# Patient Record
Sex: Female | Born: 1985 | Race: Black or African American | Hispanic: No | Marital: Single | Smoking: Never smoker
Health system: Southern US, Community
[De-identification: ages and names within clinical notes are randomized; demographics above are authoritative.]

## PROBLEM LIST (undated history)

## (undated) ENCOUNTER — Inpatient Hospital Stay (HOSPITAL_COMMUNITY): Payer: Self-pay

## (undated) DIAGNOSIS — K219 Gastro-esophageal reflux disease without esophagitis: Secondary | ICD-10-CM

## (undated) DIAGNOSIS — K589 Irritable bowel syndrome without diarrhea: Secondary | ICD-10-CM

## (undated) DIAGNOSIS — J309 Allergic rhinitis, unspecified: Secondary | ICD-10-CM

## (undated) DIAGNOSIS — F419 Anxiety disorder, unspecified: Secondary | ICD-10-CM

## (undated) DIAGNOSIS — M419 Scoliosis, unspecified: Secondary | ICD-10-CM

## (undated) HISTORY — PX: INDUCED ABORTION: SHX677

---
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
## 2007-04-29 ENCOUNTER — Emergency Department (HOSPITAL_COMMUNITY): Admission: EM | Admit: 2007-04-29 | Discharge: 2007-04-29 | Payer: Self-pay | Admitting: Emergency Medicine

## 2007-07-24 ENCOUNTER — Emergency Department (HOSPITAL_COMMUNITY): Admission: EM | Admit: 2007-07-24 | Discharge: 2007-07-24 | Payer: Self-pay | Admitting: Emergency Medicine

## 2007-11-11 ENCOUNTER — Emergency Department (HOSPITAL_COMMUNITY): Admission: EM | Admit: 2007-11-11 | Discharge: 2007-11-11 | Payer: Self-pay | Admitting: Emergency Medicine

## 2007-12-18 ENCOUNTER — Inpatient Hospital Stay (HOSPITAL_COMMUNITY): Admission: AD | Admit: 2007-12-18 | Discharge: 2007-12-18 | Payer: Self-pay | Admitting: Obstetrics & Gynecology

## 2008-07-31 ENCOUNTER — Emergency Department (HOSPITAL_COMMUNITY): Admission: EM | Admit: 2008-07-31 | Discharge: 2008-07-31 | Payer: Self-pay | Admitting: Emergency Medicine

## 2009-01-31 ENCOUNTER — Inpatient Hospital Stay (HOSPITAL_COMMUNITY): Admission: AD | Admit: 2009-01-31 | Discharge: 2009-01-31 | Payer: Self-pay | Admitting: Obstetrics and Gynecology

## 2009-04-13 ENCOUNTER — Ambulatory Visit: Payer: Self-pay | Admitting: Obstetrics & Gynecology

## 2009-04-14 ENCOUNTER — Encounter: Payer: Self-pay | Admitting: Obstetrics and Gynecology

## 2009-04-15 ENCOUNTER — Encounter: Payer: Self-pay | Admitting: Obstetrics and Gynecology

## 2009-04-15 LAB — CONVERTED CEMR LAB
HCT: 35.6 % — ABNORMAL LOW (ref 36.0–46.0)
Hemoglobin: 11.7 g/dL — ABNORMAL LOW (ref 12.0–15.0)
Hepatitis B Surface Ag: NEGATIVE
Hgb A2 Quant: 2.4 % (ref 2.2–3.2)
Hgb A: 95.3 % — ABNORMAL LOW (ref 96.8–97.8)
Hgb F Quant: 2.3 % — ABNORMAL HIGH (ref 0.0–2.0)
Hgb S Quant: 0 % (ref 0.0–0.0)
Lymphs Abs: 2.5 10*3/uL (ref 0.7–4.0)
MCHC: 32.9 g/dL (ref 30.0–36.0)
Monocytes Absolute: 0.5 10*3/uL (ref 0.1–1.0)
RBC: 3.72 M/uL — ABNORMAL LOW (ref 3.87–5.11)
Rh Type: POSITIVE
Rubella: 77 intl units/mL — ABNORMAL HIGH

## 2009-04-21 ENCOUNTER — Ambulatory Visit: Payer: Self-pay | Admitting: Obstetrics and Gynecology

## 2009-04-21 ENCOUNTER — Encounter: Payer: Self-pay | Admitting: Obstetrics and Gynecology

## 2009-04-21 ENCOUNTER — Other Ambulatory Visit: Admission: RE | Admit: 2009-04-21 | Discharge: 2009-04-21 | Payer: Self-pay | Admitting: Obstetrics & Gynecology

## 2009-04-26 ENCOUNTER — Ambulatory Visit (HOSPITAL_COMMUNITY): Admission: RE | Admit: 2009-04-26 | Discharge: 2009-04-26 | Payer: Self-pay | Admitting: Obstetrics & Gynecology

## 2009-05-19 ENCOUNTER — Ambulatory Visit: Payer: Self-pay | Admitting: Obstetrics and Gynecology

## 2009-06-19 ENCOUNTER — Ambulatory Visit: Payer: Self-pay | Admitting: Physician Assistant

## 2009-06-20 ENCOUNTER — Encounter: Payer: Self-pay | Admitting: Obstetrics and Gynecology

## 2009-06-20 LAB — CONVERTED CEMR LAB: Chlamydia, Swab/Urine, PCR: NEGATIVE

## 2009-07-14 ENCOUNTER — Encounter: Payer: Self-pay | Admitting: Obstetrics & Gynecology

## 2009-07-14 ENCOUNTER — Ambulatory Visit: Payer: Self-pay | Admitting: Obstetrics and Gynecology

## 2009-07-14 LAB — CONVERTED CEMR LAB
Platelets: 167 10*3/uL (ref 150–400)
RBC: 3.27 M/uL — ABNORMAL LOW (ref 3.87–5.11)
WBC: 8.4 10*3/uL (ref 4.0–10.5)

## 2009-07-28 ENCOUNTER — Ambulatory Visit: Payer: Self-pay | Admitting: Physician Assistant

## 2009-08-11 ENCOUNTER — Ambulatory Visit: Payer: Self-pay | Admitting: Physician Assistant

## 2009-08-25 ENCOUNTER — Ambulatory Visit: Payer: Self-pay | Admitting: Obstetrics and Gynecology

## 2009-08-28 ENCOUNTER — Ambulatory Visit: Payer: Self-pay | Admitting: Obstetrics and Gynecology

## 2009-08-28 ENCOUNTER — Inpatient Hospital Stay (HOSPITAL_COMMUNITY): Admission: AD | Admit: 2009-08-28 | Discharge: 2009-08-29 | Payer: Self-pay | Admitting: Obstetrics & Gynecology

## 2009-09-01 ENCOUNTER — Inpatient Hospital Stay (HOSPITAL_COMMUNITY): Admission: AD | Admit: 2009-09-01 | Discharge: 2009-09-01 | Payer: Self-pay | Admitting: Obstetrics & Gynecology

## 2009-09-01 ENCOUNTER — Ambulatory Visit: Payer: Self-pay | Admitting: Advanced Practice Midwife

## 2009-09-01 ENCOUNTER — Ambulatory Visit: Payer: Self-pay | Admitting: Family Medicine

## 2009-09-02 ENCOUNTER — Encounter: Payer: Self-pay | Admitting: Obstetrics & Gynecology

## 2009-09-02 LAB — CONVERTED CEMR LAB
Trich, Wet Prep: NONE SEEN
Yeast Wet Prep HPF POC: NONE SEEN

## 2009-09-08 ENCOUNTER — Ambulatory Visit: Payer: Self-pay | Admitting: Advanced Practice Midwife

## 2009-09-15 ENCOUNTER — Ambulatory Visit: Payer: Self-pay | Admitting: Family

## 2009-09-20 ENCOUNTER — Inpatient Hospital Stay (HOSPITAL_COMMUNITY): Admission: AD | Admit: 2009-09-20 | Discharge: 2009-09-20 | Payer: Self-pay | Admitting: Obstetrics & Gynecology

## 2009-09-20 ENCOUNTER — Ambulatory Visit: Payer: Self-pay | Admitting: Obstetrics and Gynecology

## 2009-09-22 ENCOUNTER — Ambulatory Visit: Payer: Self-pay | Admitting: Physician Assistant

## 2009-09-22 ENCOUNTER — Inpatient Hospital Stay (HOSPITAL_COMMUNITY): Admission: AD | Admit: 2009-09-22 | Discharge: 2009-09-25 | Payer: Self-pay | Admitting: Obstetrics and Gynecology

## 2009-09-22 ENCOUNTER — Ambulatory Visit: Payer: Self-pay | Admitting: Family Medicine

## 2009-11-10 ENCOUNTER — Ambulatory Visit: Payer: Self-pay | Admitting: Advanced Practice Midwife

## 2009-11-11 ENCOUNTER — Encounter: Payer: Self-pay | Admitting: Obstetrics and Gynecology

## 2009-12-20 ENCOUNTER — Inpatient Hospital Stay (HOSPITAL_COMMUNITY): Admission: AD | Admit: 2009-12-20 | Discharge: 2009-12-20 | Payer: Self-pay | Admitting: Obstetrics & Gynecology

## 2009-12-20 ENCOUNTER — Ambulatory Visit: Payer: Self-pay | Admitting: Obstetrics and Gynecology

## 2010-03-24 ENCOUNTER — Emergency Department (HOSPITAL_COMMUNITY): Admission: EM | Admit: 2010-03-24 | Discharge: 2010-03-24 | Payer: Self-pay | Admitting: Emergency Medicine

## 2010-07-07 ENCOUNTER — Emergency Department (HOSPITAL_COMMUNITY)
Admission: EM | Admit: 2010-07-07 | Discharge: 2010-07-07 | Payer: Self-pay | Source: Home / Self Care | Admitting: Emergency Medicine

## 2010-07-10 LAB — RAPID STREP SCREEN (MED CTR MEBANE ONLY): Streptococcus, Group A Screen (Direct): NEGATIVE

## 2010-08-08 ENCOUNTER — Emergency Department (HOSPITAL_COMMUNITY)
Admission: EM | Admit: 2010-08-08 | Discharge: 2010-08-08 | Disposition: A | Discharge status: Home / Self Care | Payer: Self-pay | Attending: Emergency Medicine | Admitting: Emergency Medicine

## 2010-08-08 DIAGNOSIS — H60399 Other infective otitis externa, unspecified ear: Secondary | ICD-10-CM | POA: Insufficient documentation

## 2010-08-08 DIAGNOSIS — H6690 Otitis media, unspecified, unspecified ear: Secondary | ICD-10-CM | POA: Insufficient documentation

## 2010-08-08 DIAGNOSIS — H9209 Otalgia, unspecified ear: Secondary | ICD-10-CM | POA: Insufficient documentation

## 2010-08-17 ENCOUNTER — Inpatient Hospital Stay (INDEPENDENT_AMBULATORY_CARE_PROVIDER_SITE_OTHER)
Admission: RE | Admit: 2010-08-17 | Discharge: 2010-08-17 | Disposition: A | Discharge status: Home / Self Care | Payer: Self-pay | Source: Ambulatory Visit

## 2010-08-17 DIAGNOSIS — IMO0002 Reserved for concepts with insufficient information to code with codable children: Secondary | ICD-10-CM

## 2010-08-17 DIAGNOSIS — H60509 Unspecified acute noninfective otitis externa, unspecified ear: Secondary | ICD-10-CM

## 2010-08-20 ENCOUNTER — Inpatient Hospital Stay (INDEPENDENT_AMBULATORY_CARE_PROVIDER_SITE_OTHER)
Admission: RE | Admit: 2010-08-20 | Discharge: 2010-08-20 | Disposition: A | Discharge status: Home / Self Care | Payer: Self-pay | Source: Ambulatory Visit | Attending: Family Medicine | Admitting: Family Medicine

## 2010-08-20 DIAGNOSIS — H60399 Other infective otitis externa, unspecified ear: Secondary | ICD-10-CM

## 2010-08-21 LAB — CULTURE, ROUTINE-ABSCESS

## 2010-08-30 LAB — URINE MICROSCOPIC-ADD ON

## 2010-08-30 LAB — URINALYSIS, ROUTINE W REFLEX MICROSCOPIC
Glucose, UA: NEGATIVE mg/dL
Specific Gravity, Urine: 1.036 — ABNORMAL HIGH (ref 1.005–1.030)
Urobilinogen, UA: 1 mg/dL (ref 0.0–1.0)
pH: 5.5 (ref 5.0–8.0)

## 2010-08-30 LAB — POCT PREGNANCY, URINE: Preg Test, Ur: NEGATIVE

## 2010-09-02 LAB — URINALYSIS, ROUTINE W REFLEX MICROSCOPIC
Glucose, UA: NEGATIVE mg/dL
Leukocytes, UA: NEGATIVE
Nitrite: NEGATIVE
Protein, ur: NEGATIVE mg/dL
Urobilinogen, UA: 0.2 mg/dL (ref 0.0–1.0)

## 2010-09-02 LAB — CBC
MCHC: 34.6 g/dL (ref 30.0–36.0)
RBC: 3.76 MIL/uL — ABNORMAL LOW (ref 3.87–5.11)

## 2010-09-02 LAB — URINE MICROSCOPIC-ADD ON

## 2010-09-02 LAB — POCT PREGNANCY, URINE: Preg Test, Ur: NEGATIVE

## 2010-09-05 LAB — CBC
HCT: 32.7 % — ABNORMAL LOW (ref 36.0–46.0)
MCV: 91.3 fL (ref 78.0–100.0)
RBC: 3.58 MIL/uL — ABNORMAL LOW (ref 3.87–5.11)
RDW: 13.4 % (ref 11.5–15.5)
WBC: 11.1 10*3/uL — ABNORMAL HIGH (ref 4.0–10.5)

## 2010-09-05 LAB — RPR: RPR Ser Ql: NONREACTIVE

## 2010-09-10 LAB — WET PREP, GENITAL
Trich, Wet Prep: NONE SEEN
Yeast Wet Prep HPF POC: NONE SEEN

## 2010-09-10 LAB — URINALYSIS, ROUTINE W REFLEX MICROSCOPIC
Specific Gravity, Urine: 1.005 (ref 1.005–1.030)
Urobilinogen, UA: 0.2 mg/dL (ref 0.0–1.0)

## 2010-09-22 LAB — URINALYSIS, ROUTINE W REFLEX MICROSCOPIC
Bilirubin Urine: NEGATIVE
Glucose, UA: NEGATIVE mg/dL
Ketones, ur: NEGATIVE mg/dL
Nitrite: NEGATIVE
Protein, ur: NEGATIVE mg/dL
Specific Gravity, Urine: 1.01 (ref 1.005–1.030)
Urobilinogen, UA: 0.2 mg/dL (ref 0.0–1.0)
pH: 7.5 (ref 5.0–8.0)

## 2010-09-22 LAB — GC/CHLAMYDIA PROBE AMP, URINE
Chlamydia, Swab/Urine, PCR: POSITIVE — AB
GC Probe Amp, Urine: NEGATIVE

## 2010-09-22 LAB — URINE MICROSCOPIC-ADD ON

## 2010-10-02 LAB — COMPREHENSIVE METABOLIC PANEL
ALT: 121 U/L — ABNORMAL HIGH (ref 0–35)
AST: 54 U/L — ABNORMAL HIGH (ref 0–37)
Albumin: 3.9 g/dL (ref 3.5–5.2)
Alkaline Phosphatase: 63 U/L (ref 39–117)
BUN: 5 mg/dL — ABNORMAL LOW (ref 6–23)
CO2: 27 mEq/L (ref 19–32)
Calcium: 8.9 mg/dL (ref 8.4–10.5)
Chloride: 104 mEq/L (ref 96–112)
Creatinine, Ser: 0.53 mg/dL (ref 0.4–1.2)
GFR calc Af Amer: >60 mL/min (ref >60)
GFR calc non Af Amer: >60 mL/min (ref >60)
Glucose, Bld: 100 mg/dL — ABNORMAL HIGH (ref 70–99)
Potassium: 4 mEq/L (ref 3.5–5.1)
Sodium: 138 mEq/L (ref 135–145)
Total Bilirubin: 1.1 mg/dL (ref 0.3–1.2)
Total Protein: 6.5 g/dL (ref 6.0–8.3)

## 2010-10-02 LAB — DIFFERENTIAL
Basophils Absolute: 0 10*3/uL (ref 0.0–0.1)
Basophils Relative: 0 % (ref 0–1)
Eosinophils Absolute: 0.1 10*3/uL (ref 0.0–0.7)
Eosinophils Relative: 1 % (ref 0–5)
Lymphocytes Relative: 21 % (ref 12–46)
Lymphs Abs: 1.6 10*3/uL (ref 0.7–4.0)
Monocytes Absolute: 0.8 10*3/uL (ref 0.1–1.0)
Monocytes Relative: 11 % (ref 3–12)
Neutro Abs: 5.1 10*3/uL (ref 1.7–7.7)
Neutrophils Relative %: 67 % (ref 43–77)

## 2010-10-02 LAB — URINALYSIS, ROUTINE W REFLEX MICROSCOPIC
Bilirubin Urine: NEGATIVE
Glucose, UA: NEGATIVE mg/dL
Hgb urine dipstick: NEGATIVE
Ketones, ur: NEGATIVE mg/dL
Nitrite: NEGATIVE
Protein, ur: NEGATIVE mg/dL
Specific Gravity, Urine: 1.007 (ref 1.005–1.030)
Urobilinogen, UA: 0.2 mg/dL (ref 0.0–1.0)
pH: 6.5 (ref 5.0–8.0)

## 2010-10-02 LAB — CBC
HCT: 37.9 % (ref 36.0–46.0)
Hemoglobin: 13.2 g/dL (ref 12.0–15.0)
MCHC: 34.7 g/dL (ref 30.0–36.0)
MCV: 95.9 fL (ref 78.0–100.0)
Platelets: 178 10*3/uL (ref 150–400)
RBC: 3.95 MIL/uL (ref 3.87–5.11)
RDW: 12.7 % (ref 11.5–15.5)
WBC: 7.6 10*3/uL (ref 4.0–10.5)

## 2010-10-02 LAB — LIPASE, BLOOD: Lipase: 29 U/L (ref 11–59)

## 2010-10-02 LAB — POCT PREGNANCY, URINE: Preg Test, Ur: NEGATIVE

## 2010-10-09 ENCOUNTER — Emergency Department (HOSPITAL_COMMUNITY)
Admission: EM | Admit: 2010-10-09 | Discharge: 2010-10-10 | Disposition: A | Discharge status: Home / Self Care | Payer: Worker's Compensation | Attending: Urology | Admitting: Urology

## 2010-10-09 DIAGNOSIS — Y990 Civilian activity done for income or pay: Secondary | ICD-10-CM | POA: Insufficient documentation

## 2010-10-09 DIAGNOSIS — S61209A Unspecified open wound of unspecified finger without damage to nail, initial encounter: Secondary | ICD-10-CM | POA: Insufficient documentation

## 2010-10-09 DIAGNOSIS — W3189XA Contact with other specified machinery, initial encounter: Secondary | ICD-10-CM | POA: Insufficient documentation

## 2010-10-09 DIAGNOSIS — Y9269 Other specified industrial and construction area as the place of occurrence of the external cause: Secondary | ICD-10-CM | POA: Insufficient documentation

## 2010-10-30 NOTE — Assessment & Plan Note (Signed)
Bethany Odonnell, SCHOLTEN NO.:  [id number]   MEDICAL RECORD NO.:  [id number]           PATIENT TYPE:   LOCATION:  [facility]           FACILITY:   PHYSICIAN:  Wynelle Bourgeois, CNM    DATE OF BIRTH:  10-Jun-1986   DATE OF SERVICE:  11/10/2009                                  CLINIC NOTE   This is a 25 year old gravida 2, para 1-0-1-1, who is 7 weeks postpartum  who returns for her postpartum visit today.  She had a spontaneous  vaginal delivery of a female infant, name Samiyah, with Dr. Claudius Sis on  September 23, 2009.  She had a second-degree laceration which was repaired  and she had no other complications from delivery other than light  meconium-stained fluid.  Baby was delivered at 40-2/7th weeks' gestation  and has done well.  She has had a good recovery since delivery.  She has  had some discomfort with stitches which got better about a week ago.  Lochia stopped several weeks ago and she has not had a period yet.  She  breastfed baby for 2 weeks and baby is now on formula.  She is [facility].  She is considering Implanon for contraception.   PHYSICAL EXAMINATION:  VITAL SIGNS:  Pulse 96, blood pressure 118/65,  weight 149, height 67 inches.  ABDOMEN:  Soft and nontender.  There are no masses appreciated.  PELVIC:  Shows a well-healed laceration with no lesions or  abnormalities.  Vagina is pink and well rugated.  Cervix is closed,  multiparous.  Uterus is well involuted.  There are no masses appreciated  and uterus is nontender.  There is no lochia.  EXTREMITIES:  Within normal limits.   ALLERGIES:  None.   Last Pap was in November 2010.   ASSESSMENT:  1. A 7 weeks post vaginal delivery of viable female infant.  2. Desires Implanon contraception.   PLAN:  1. The patient will return after her next period for Implanon      insertion.  2. The patient will return in November for her annual exam and Pap      smear.  3. The patient will  call us if she has any other concerns.           ______________________________  Wynelle Bourgeois, CNM     MW/MEDQ  D:  11/10/2009  T:  11/11/2009  Job:  [id number]

## 2011-03-05 ENCOUNTER — Inpatient Hospital Stay (HOSPITAL_COMMUNITY)
Admission: AD | Admit: 2011-03-05 | Discharge: 2011-03-05 | Disposition: A | Discharge status: Home / Self Care | Payer: Self-pay | Source: Ambulatory Visit | Attending: Obstetrics & Gynecology | Admitting: Obstetrics & Gynecology

## 2011-03-05 DIAGNOSIS — N938 Other specified abnormal uterine and vaginal bleeding: Secondary | ICD-10-CM | POA: Insufficient documentation

## 2011-03-05 DIAGNOSIS — A5901 Trichomonal vulvovaginitis: Secondary | ICD-10-CM

## 2011-03-05 DIAGNOSIS — N949 Unspecified condition associated with female genital organs and menstrual cycle: Secondary | ICD-10-CM | POA: Insufficient documentation

## 2011-03-05 LAB — CBC
MCH: 32 pg (ref 26.0–34.0)
MCHC: 33.7 g/dL (ref 30.0–36.0)
MCV: 95.1 fL (ref 78.0–100.0)
Platelets: 218 10*3/uL (ref 150–400)
RBC: 3.87 MIL/uL (ref 3.87–5.11)

## 2011-03-05 LAB — WET PREP, GENITAL: Yeast Wet Prep HPF POC: NONE SEEN

## 2011-03-05 LAB — URINALYSIS, ROUTINE W REFLEX MICROSCOPIC
Bilirubin Urine: NEGATIVE
Glucose, UA: NEGATIVE mg/dL
Hgb urine dipstick: NEGATIVE
Specific Gravity, Urine: 1.015 (ref 1.005–1.030)
Urobilinogen, UA: 1 mg/dL (ref 0.0–1.0)
pH: 8 (ref 5.0–8.0)

## 2011-03-05 LAB — POCT PREGNANCY, URINE: Preg Test, Ur: NEGATIVE

## 2011-03-05 MED ORDER — METRONIDAZOLE 500 MG PO TABS: 500.0000 mg | ORAL_TABLET | Freq: Two times a day (BID) | ORAL | Status: AC

## 2011-03-05 NOTE — ED Provider Notes (Signed)
History   Pt presents today c/o heavy vag bleeding and lower abd pain since 02/25/11. She states she started her menses as usual but the bleeding has continued. She denies fever, vag irritation, or any other sx at this time.  Chief Complaint  Patient presents with  . Vaginal Bleeding   HPI  OB History    No data available      No past medical history on file.  No past surgical history on file.  No family history on file.  History  Substance Use Topics  . Smoking status: Not on file  . Smokeless tobacco: Not on file  . Alcohol Use: Not on file    Allergies: Allergies not on file  No prescriptions prior to admission    Review of Systems  Constitutional: Negative for fever.  Cardiovascular: Negative for chest pain.  Gastrointestinal: Positive for abdominal pain. Negative for nausea, vomiting, diarrhea and constipation.  Genitourinary: Negative for dysuria, urgency, frequency and hematuria.  Neurological: Negative for dizziness and headaches.  Psychiatric/Behavioral: Negative for depression and suicidal ideas.   Physical Exam   Blood pressure 110/67, pulse 72, temperature 98.3 F (36.8 C), temperature source Oral, resp. rate 20, height 5\' 7"  (1.702 m), weight 133 lb (60.328 kg).  Physical Exam  Constitutional: She is oriented to person, place, and time. She appears well-developed and well-nourished.  HENT:  Head: Normocephalic and atraumatic.  Eyes: EOM are normal. Pupils are equal, round, and reactive to light.  GI: Soft. She exhibits no distension. There is tenderness. There is no rebound and no guarding.  Genitourinary: There is bleeding around the vagina. Vaginal discharge found.       Uterus is NL size and shape. No adnexal masses. Pt is nontender on exam.  Neurological: She is alert and oriented to person, place, and time.  Skin: Skin is warm and dry.  Psychiatric: She has a normal mood and affect. Her behavior is normal. Judgment and thought content normal.     MAU Course  Procedures  Results for orders placed during the hospital encounter of 03/05/11 (from the past 24 hour(s))  URINALYSIS, ROUTINE W REFLEX MICROSCOPIC     Status: Normal   Collection Time   03/05/11  8:05 PM      Component Value Range   Color, Urine YELLOW  YELLOW    Appearance CLEAR  CLEAR    Specific Gravity, Urine 1.015  1.005 - 1.030    pH 8.0  5.0 - 8.0    Glucose, UA NEGATIVE  NEGATIVE (mg/dL)   Hgb urine dipstick NEGATIVE  NEGATIVE    Bilirubin Urine NEGATIVE  NEGATIVE    Ketones, ur NEGATIVE  NEGATIVE (mg/dL)   Protein, ur NEGATIVE  NEGATIVE (mg/dL)   Urobilinogen, UA 1.0  0.0 - 1.0 (mg/dL)   Nitrite NEGATIVE  NEGATIVE    Leukocytes, UA NEGATIVE  NEGATIVE   POCT PREGNANCY, URINE     Status: Normal   Collection Time   03/05/11  8:10 PM      Component Value Range   Preg Test, Ur NEGATIVE    WET PREP, GENITAL     Status: Abnormal   Collection Time   03/05/11  9:30 PM      Component Value Range   Yeast, Wet Prep NONE SEEN  NONE SEEN    Trich, Wet Prep RARE (*) NONE SEEN    Clue Cells, Wet Prep FEW (*) NONE SEEN    WBC, Wet Prep HPF POC MODERATE (*) NONE SEEN  CBC     Status: Normal   Collection Time   03/05/11  9:30 PM      Component Value Range   WBC 5.1  4.0 - 10.5 (K/uL)   RBC 3.87  3.87 - 5.11 (MIL/uL)   Hemoglobin 12.4  12.0 - 15.0 (g/dL)   HCT 16.1  09.6 - 04.5 (%)   MCV 95.1  78.0 - 100.0 (fL)   MCH 32.0  26.0 - 34.0 (pg)   MCHC 33.7  30.0 - 36.0 (g/dL)   RDW 40.9  81.1 - 91.4 (%)   Platelets 218  150 - 400 (K/uL)     Assessment and Plan  Trich: discussed with pt at length. Will tx with Flagyl. Warned of antabuse reaction. Discussed safe sex precautions. Discussed diet, activity, risks, and precautions.  Clinton Gallant. Dametria Tuzzolino III, DrHSc, MPAS, PA-C  03/05/2011, 9:37 PM   Henrietta Hoover, PA 03/05/11 2229

## 2011-03-05 NOTE — Progress Notes (Signed)
Pt states she started her period on 09/10-and it has lasted 9 days and she has a lot of cramping-note she is not wearing a pad at present-not using birth control

## 2011-03-06 LAB — GC/CHLAMYDIA PROBE AMP, GENITAL: GC Probe Amp, Genital: NEGATIVE

## 2011-03-08 LAB — RAPID STREP SCREEN (MED CTR MEBANE ONLY): Streptococcus, Group A Screen (Direct): NEGATIVE

## 2011-03-08 LAB — INFLUENZA A+B VIRUS AG-DIRECT(RAPID): Influenza B Ag: NEGATIVE

## 2011-03-14 LAB — POCT PREGNANCY, URINE: Preg Test, Ur: NEGATIVE

## 2011-03-14 LAB — URINALYSIS, ROUTINE W REFLEX MICROSCOPIC
Bilirubin Urine: NEGATIVE
Glucose, UA: NEGATIVE
Nitrite: NEGATIVE
Specific Gravity, Urine: 1.01
pH: 6

## 2011-03-14 LAB — URINE MICROSCOPIC-ADD ON

## 2011-03-26 LAB — COMPREHENSIVE METABOLIC PANEL
ALT: 11
AST: 16
Albumin: 3.9
Alkaline Phosphatase: 56
BUN: 10
CO2: 27
Calcium: 9
Chloride: 105
Creatinine, Ser: 0.59
GFR calc Af Amer: >60
GFR calc non Af Amer: >60
Glucose, Bld: 89
Potassium: 3.7
Sodium: 137
Total Bilirubin: 0.7
Total Protein: 6.5

## 2011-03-26 LAB — URINE MICROSCOPIC-ADD ON

## 2011-03-26 LAB — LIPASE, BLOOD: Lipase: 38

## 2011-03-26 LAB — URINALYSIS, ROUTINE W REFLEX MICROSCOPIC
Bilirubin Urine: NEGATIVE
Glucose, UA: NEGATIVE
Hgb urine dipstick: NEGATIVE
Ketones, ur: NEGATIVE
Nitrite: POSITIVE — AB
Protein, ur: NEGATIVE
Specific Gravity, Urine: 1.027
Urobilinogen, UA: 0.2
pH: 6

## 2011-03-26 LAB — PREGNANCY, URINE: Preg Test, Ur: NEGATIVE

## 2011-04-04 LAB — URINALYSIS, ROUTINE W REFLEX MICROSCOPIC
Glucose, UA: NEGATIVE
Hgb urine dipstick: NEGATIVE
Ketones, ur: >80 — AB
Nitrite: NEGATIVE
Protein, ur: NEGATIVE
Specific Gravity, Urine: 1.034 — ABNORMAL HIGH
Urobilinogen, UA: 0.2
Urobilinogen, UA: 1

## 2011-04-04 LAB — PREGNANCY, URINE: Preg Test, Ur: NEGATIVE

## 2011-04-04 LAB — URINE MICROSCOPIC-ADD ON

## 2011-04-04 LAB — GC/CHLAMYDIA PROBE AMP, GENITAL: GC Probe Amp, Genital: NEGATIVE

## 2011-04-04 LAB — WET PREP, GENITAL
Trich, Wet Prep: NONE SEEN
Yeast Wet Prep HPF POC: NONE SEEN

## 2012-05-01 ENCOUNTER — Emergency Department (HOSPITAL_COMMUNITY)
Admission: EM | Admit: 2012-05-01 | Discharge: 2012-05-01 | Disposition: A | Discharge status: Home / Self Care | Payer: Self-pay | Attending: Emergency Medicine | Admitting: Emergency Medicine

## 2012-05-01 ENCOUNTER — Encounter (HOSPITAL_COMMUNITY): Payer: Self-pay | Admitting: *Deleted

## 2012-05-01 DIAGNOSIS — Z79899 Other long term (current) drug therapy: Secondary | ICD-10-CM | POA: Insufficient documentation

## 2012-05-01 DIAGNOSIS — R300 Dysuria: Secondary | ICD-10-CM | POA: Insufficient documentation

## 2012-05-01 DIAGNOSIS — Z8719 Personal history of other diseases of the digestive system: Secondary | ICD-10-CM | POA: Insufficient documentation

## 2012-05-01 DIAGNOSIS — N12 Tubulo-interstitial nephritis, not specified as acute or chronic: Secondary | ICD-10-CM

## 2012-05-01 DIAGNOSIS — Z8739 Personal history of other diseases of the musculoskeletal system and connective tissue: Secondary | ICD-10-CM | POA: Insufficient documentation

## 2012-05-01 DIAGNOSIS — K219 Gastro-esophageal reflux disease without esophagitis: Secondary | ICD-10-CM | POA: Insufficient documentation

## 2012-05-01 HISTORY — DX: Irritable bowel syndrome without diarrhea: K58.9

## 2012-05-01 HISTORY — DX: Scoliosis, unspecified: M41.9

## 2012-05-01 HISTORY — DX: Gastro-esophageal reflux disease without esophagitis: K21.9

## 2012-05-01 LAB — URINALYSIS, ROUTINE W REFLEX MICROSCOPIC
Glucose, UA: NEGATIVE mg/dL
Leukocytes, UA: NEGATIVE
pH: 5.5 (ref 5.0–8.0)

## 2012-05-01 LAB — URINE MICROSCOPIC-ADD ON

## 2012-05-01 LAB — PREGNANCY, URINE: Preg Test, Ur: NEGATIVE

## 2012-05-01 MED ORDER — HYDROCODONE-ACETAMINOPHEN 5-325 MG PO TABS: 2.0000 | ORAL_TABLET | ORAL | Status: DC | PRN

## 2012-05-01 MED ORDER — CIPROFLOXACIN HCL 500 MG PO TABS: 500.0000 mg | ORAL_TABLET | Freq: Two times a day (BID) | ORAL | Status: DC

## 2012-05-01 NOTE — ED Provider Notes (Signed)
History     CSN: [id number]  Arrival date & time 05/01/12  4098   First MD Initiated Contact with Patient 05/01/12 0601      Chief Complaint  Patient presents with  . Flank Pain    (Consider location/radiation/quality/duration/timing/severity/associated sxs/prior treatment) HPI Comments: This is a 26 year old female, who presents to the ED with a chief complaint of dysuria and flank pain.  The patient states that there urinary symptoms started about 1.5 weeks ago.  However, she states that the flank pain began today.  She states that she has tried drinking cranberry juice with no relief.  Her pain is a 8/10, however she does not appear to be in any distress.  Patient denies vomiting, fever, and vaginal discharge.  The history is provided by the patient. No language interpreter was used.    Past Medical History  Diagnosis Date  . GERD (gastroesophageal reflux disease)   . Scoliosis   . IBS (irritable bowel syndrome)     Past Surgical History  Procedure Date  . Induced abortion     No family history on file.  History  Substance Use Topics  . Smoking status: Never Smoker   . Smokeless tobacco: Not on file  . Alcohol Use: Yes     Comment: occasionally    OB History    Grav Para Term Preterm Abortions TAB SAB Ect Mult Living                  Review of Systems  All other systems reviewed and are negative.    Allergies  Amoxicillin and Penicillins  Home Medications   Current Outpatient Rx  Name  Route  Sig  Dispense  Refill  . OVER THE COUNTER MEDICATION   Oral   Take 1 capsule by mouth daily. probiotics           BP 118/61  Pulse 83  Temp 97.8 F (36.6 C) (Oral)  Resp 18  SpO2 100%  LMP 04/18/2012  Physical Exam  Nursing note and vitals reviewed. Constitutional: She is oriented to person, place, and time. She appears well-developed and well-nourished.  HENT:  Head: Normocephalic and atraumatic.  Eyes: Conjunctivae normal and EOM are normal.  Pupils are equal, round, and reactive to light.  Neck: Normal range of motion. Neck supple.  Cardiovascular: Normal rate and regular rhythm.  Exam reveals no gallop and no friction rub.   No murmur heard. Pulmonary/Chest: Effort normal and breath sounds normal. No respiratory distress. She has no wheezes. She has no rales. She exhibits no tenderness.  Abdominal: Soft. Bowel sounds are normal. She exhibits no distension and no mass. There is no tenderness. There is no rebound and no guarding.  Musculoskeletal: Normal range of motion. She exhibits no edema and no tenderness.       No CVA tenderness  Neurological: She is alert and oriented to person, place, and time.  Skin: Skin is warm and dry.  Psychiatric: She has a normal mood and affect. Her behavior is normal. Judgment and thought content normal.    ED Course  Procedures (including critical care time)  Labs Reviewed  URINALYSIS, ROUTINE W REFLEX MICROSCOPIC - Abnormal; Notable for the following:    Color, Urine AMBER (*)  BIOCHEMICALS MAY BE AFFECTED BY COLOR   APPearance CLOUDY (*)     Hgb urine dipstick MODERATE (*)     Bilirubin Urine SMALL (*)     Nitrite POSITIVE (*)     All  other components within normal limits  URINE MICROSCOPIC-ADD ON - Abnormal; Notable for the following:    Squamous Epithelial / LPF FEW (*)     Bacteria, UA MANY (*)     All other components within normal limits  PREGNANCY, URINE  URINE CULTURE   Results for orders placed during the hospital encounter of 05/01/12  URINALYSIS, ROUTINE W REFLEX MICROSCOPIC      Component Value Range   Color, Urine AMBER (*) YELLOW   APPearance CLOUDY (*) CLEAR   Specific Gravity, Urine 1.030  1.005 - 1.030   pH 5.5  5.0 - 8.0   Glucose, UA NEGATIVE  NEGATIVE mg/dL   Hgb urine dipstick MODERATE (*) NEGATIVE   Bilirubin Urine SMALL (*) NEGATIVE   Ketones, ur NEGATIVE  NEGATIVE mg/dL   Protein, ur NEGATIVE  NEGATIVE mg/dL   Urobilinogen, UA 1.0  0.0 - 1.0 mg/dL    Nitrite POSITIVE (*) NEGATIVE   Leukocytes, UA NEGATIVE  NEGATIVE  PREGNANCY, URINE      Component Value Range   Preg Test, Ur NEGATIVE  NEGATIVE  URINE MICROSCOPIC-ADD ON      Component Value Range   Squamous Epithelial / LPF FEW (*) RARE   WBC, UA 3-6  <3 WBC/hpf   RBC / HPF 3-6  <3 RBC/hpf   Bacteria, UA MANY (*) RARE        1. Pyelonephritis       MDM  26 year old female with pyelonephritis.  I have discussed this patient with Dr. Norlene Campbell.  I am going to discharge the patient to home with PCP follow-up.  I am going to treat her with Cipro and give her a few Norco for pain.  Return precautions have been given. The patient understands and is agreeable with this plan.  The patient is stable and ready for discharge.  Treatments: 1. Cipro 2. Rojelio Brenner, [place] 05/01/12 [phone]

## 2012-05-01 NOTE — ED Notes (Signed)
The patient is AOx4 and comfortable with the discharge instructions. 

## 2012-05-01 NOTE — ED Notes (Signed)
PT to ED c/o cloudy, smelly urine x 2 weeks and increasing bil flank pain.  Tonight she came b/c the pain is getting unbearable.  Tonight the R flank pain greater than L.

## 2012-05-01 NOTE — ED Provider Notes (Signed)
Medical screening examination/treatment/procedure(s) were performed by non-physician practitioner and as supervising physician I was immediately available for consultation/collaboration.  Olivia Mackie, MD 05/01/12 [phone]

## 2012-05-02 LAB — URINE CULTURE

## 2012-05-03 NOTE — ED Notes (Signed)
+  Urine. Patient treated with Cipro. Sensitive to same. Per protocol MD. °

## 2012-08-14 ENCOUNTER — Emergency Department (HOSPITAL_COMMUNITY)
Admission: EM | Admit: 2012-08-14 | Discharge: 2012-08-14 | Disposition: A | Discharge status: Home / Self Care | Payer: Self-pay | Attending: Emergency Medicine | Admitting: Emergency Medicine

## 2012-08-14 ENCOUNTER — Encounter (HOSPITAL_COMMUNITY): Payer: Self-pay | Admitting: Emergency Medicine

## 2012-08-14 DIAGNOSIS — Z8739 Personal history of other diseases of the musculoskeletal system and connective tissue: Secondary | ICD-10-CM | POA: Insufficient documentation

## 2012-08-14 DIAGNOSIS — J029 Acute pharyngitis, unspecified: Secondary | ICD-10-CM | POA: Insufficient documentation

## 2012-08-14 DIAGNOSIS — J069 Acute upper respiratory infection, unspecified: Secondary | ICD-10-CM | POA: Insufficient documentation

## 2012-08-14 DIAGNOSIS — R059 Cough, unspecified: Secondary | ICD-10-CM | POA: Insufficient documentation

## 2012-08-14 DIAGNOSIS — Z8719 Personal history of other diseases of the digestive system: Secondary | ICD-10-CM | POA: Insufficient documentation

## 2012-08-14 LAB — RAPID STREP SCREEN (MED CTR MEBANE ONLY): Streptococcus, Group A Screen (Direct): NEGATIVE

## 2012-08-14 MED ORDER — MAGIC MOUTHWASH W/LIDOCAINE: 10.0000 mL | Freq: Three times a day (TID) | ORAL | Status: DC | PRN

## 2012-08-14 MED ORDER — IBUPROFEN 800 MG PO TABS: 800.0000 mg | ORAL_TABLET | Freq: Four times a day (QID) | ORAL | Status: DC | PRN

## 2012-08-14 MED ORDER — MAGIC MOUTHWASH W/LIDOCAINE
10.0000 mL | Freq: Three times a day (TID) | ORAL | Status: DC | PRN
  Filled 2012-08-14: qty 10

## 2012-08-14 MED ORDER — IBUPROFEN 800 MG PO TABS
800.0000 mg | ORAL_TABLET | Freq: Once | ORAL | Status: AC
  Administered 2012-08-14: 800 mg via ORAL
  Filled 2012-08-14: qty 1

## 2012-08-14 MED ORDER — HYDROCOD POLST-CHLORPHEN POLST 10-8 MG/5ML PO LQCR: 5.0000 mL | Freq: Two times a day (BID) | ORAL | Status: DC | PRN

## 2012-08-14 MED ORDER — HYDROCOD POLST-CHLORPHEN POLST 10-8 MG/5ML PO LQCR
5.0000 mL | Freq: Once | ORAL | Status: AC
  Administered 2012-08-14: 5 mL via ORAL
  Filled 2012-08-14: qty 5

## 2012-08-14 NOTE — ED Provider Notes (Signed)
History     CSN: [id number]  Arrival date & time 08/14/12  0354   First MD Initiated Contact with Patient 08/14/12 [phone]      Chief Complaint  Patient presents with  . Sore Throat    (Consider location/radiation/quality/duration/timing/severity/associated sxs/prior treatment) HPI 27 yo female with 3 days of cough, runny nose, congestion and sore throat.  She reports her sore throat has worsened over the last day.  She has been taking otc medications without improvement.  Subjective fevers.  No known sick contacts.  Past Medical History  Diagnosis Date  . GERD (gastroesophageal reflux disease)   . Scoliosis   . IBS (irritable bowel syndrome)     Past Surgical History  Procedure Laterality Date  . Induced abortion      No family history on file.  History  Substance Use Topics  . Smoking status: Never Smoker   . Smokeless tobacco: Not on file  . Alcohol Use: Yes     Comment: occasionally    OB History   Grav Para Term Preterm Abortions TAB SAB Ect Mult Living                  Review of Systems  All other systems reviewed and are negative.    Allergies  Amoxicillin and Penicillins  Home Medications   Current Outpatient Rx  Name  Route  Sig  Dispense  Refill  . phenol (CHLORASEPTIC) 1.4 % LIQD   Mouth/Throat   Use as directed 1 spray in the mouth or throat as needed (for sore throat).         . Pseudoephedrine-APAP-DM (DAYQUIL MULTI-SYMPTOM) 60-650-20 MG/30ML LIQD   Oral   Take 30 mLs by mouth every 6 (six) hours as needed (for cough).         . Alum & Mag Hydroxide-Simeth (MAGIC MOUTHWASH W/LIDOCAINE) SOLN   Oral   Take 10 mLs by mouth 3 (three) times daily as needed (gargle and swallow).   120 mL   0   . chlorpheniramine-HYDROcodone (TUSSIONEX) 10-8 MG/5ML LQCR   Oral   Take 5 mLs by mouth every 12 (twelve) hours as needed (cough).   115 mL   0   . ibuprofen (ADVIL,MOTRIN) 800 MG tablet   Oral   Take 1 tablet (800 mg total) by mouth  every 6 (six) hours as needed for pain or fever.   30 tablet   0     BP 109/72  Pulse 90  Temp(Src) 98.7 F (37.1 C) (Oral)  Resp 16  SpO2 100%  Physical Exam  Nursing note and vitals reviewed. Constitutional: She is oriented to person, place, and time. She appears well-developed and well-nourished.  HENT:  Head: Normocephalic and atraumatic.  Right Ear: External ear normal.  Left Ear: External ear normal.  Mouth/Throat: Oropharyngeal exudate (mild erythema.  uvula midline, no asymmetry to tonsils) present.  rhinorrhea  Eyes: Conjunctivae and EOM are normal. Pupils are equal, round, and reactive to light.  Neck: Normal range of motion. Neck supple. No JVD present. No tracheal deviation present. No thyromegaly present.  Cardiovascular: Normal rate, regular rhythm, normal heart sounds and intact distal pulses.  Exam reveals no gallop and no friction rub.   No murmur heard. Pulmonary/Chest: Effort normal and breath sounds normal. No stridor. No respiratory distress. She has no wheezes. She has no rales. She exhibits no tenderness.  Abdominal: Soft. Bowel sounds are normal. She exhibits no distension and no mass. There is no tenderness. There is  no rebound and no guarding.  Musculoskeletal: Normal range of motion. She exhibits no edema and no tenderness.  Lymphadenopathy:    She has no cervical adenopathy.  Neurological: She is alert and oriented to person, place, and time. No cranial nerve deficit. She exhibits normal muscle tone. Coordination normal.  Skin: Skin is warm and dry. No rash noted. No erythema. No pallor.  Psychiatric: She has a normal mood and affect. Her behavior is normal. Judgment and thought content normal.    ED Course  Procedures (including critical care time)  Labs Reviewed  RAPID STREP SCREEN   No results found.   1. Viral pharyngitis   2. Viral upper respiratory illness       MDM  27 yo female with negative strep, sxs c/w viral URI.  Will treat  symptomatically.        Olivia Mackie, MD 08/14/12 [phone]

## 2012-08-14 NOTE — ED Notes (Signed)
Per pt, pt started having sore throat approx 3 days ago. Pain got worse yesterday and was unrelieved by OTC medication. Pt states she is having some trouble swallowing.

## 2012-08-14 NOTE — Progress Notes (Signed)
[facility] ED CM consulted by Raelyn Number at [facility] pharmacy [phone] stating she was requested to call CM by [facility] flow manager to assist with change of medication for pt.  Pt left [facility] 08/14/12 with rx for Tussinex 5 ml po q 12 hrs prn - cost to self pt is $71.09 CM consulted with CM staff for possible MATCH assist This is not a medication provided through [facility] pharmacist recommends comparable medication of Cherry Tussin to be provided at a lower cost [facility] ED CM called to speak with [facility] EDP (Otter left, covering is Production manager) about this issue Order given for [employer] tussin 5 ml po q 12 hrs prn Order called in via telephone to [facility] pharmacist Dois Davenport who reports pt is to return to pharmacy to pick up medication At 1542 CM spoke with Dezeray at [phone] to update her about change of medication and pt to return to pharmacy Pt appreciative of services rendered Cm signing off

## 2012-09-15 ENCOUNTER — Telehealth (HOSPITAL_COMMUNITY): Payer: Self-pay | Admitting: Emergency Medicine

## 2012-09-15 NOTE — ED Notes (Signed)
Bethany Odonnell from [facility] pharmacy called about prescription written 08/14/12 and was told to discard script as being too old.

## 2012-11-26 ENCOUNTER — Emergency Department (HOSPITAL_COMMUNITY)
Admission: EM | Admit: 2012-11-26 | Discharge: 2012-11-26 | Disposition: A | Discharge status: Home / Self Care | Payer: Self-pay | Attending: Emergency Medicine | Admitting: Emergency Medicine

## 2012-11-26 ENCOUNTER — Encounter (HOSPITAL_COMMUNITY): Payer: Self-pay

## 2012-11-26 DIAGNOSIS — Z8719 Personal history of other diseases of the digestive system: Secondary | ICD-10-CM | POA: Insufficient documentation

## 2012-11-26 DIAGNOSIS — F410 Panic disorder [episodic paroxysmal anxiety] without agoraphobia: Secondary | ICD-10-CM

## 2012-11-26 DIAGNOSIS — K219 Gastro-esophageal reflux disease without esophagitis: Secondary | ICD-10-CM | POA: Insufficient documentation

## 2012-11-26 DIAGNOSIS — Z8739 Personal history of other diseases of the musculoskeletal system and connective tissue: Secondary | ICD-10-CM | POA: Insufficient documentation

## 2012-11-26 DIAGNOSIS — F411 Generalized anxiety disorder: Secondary | ICD-10-CM | POA: Insufficient documentation

## 2012-11-26 DIAGNOSIS — Z880 Allergy status to penicillin: Secondary | ICD-10-CM | POA: Insufficient documentation

## 2012-11-26 DIAGNOSIS — R42 Dizziness and giddiness: Secondary | ICD-10-CM | POA: Insufficient documentation

## 2012-11-26 HISTORY — DX: Anxiety disorder, unspecified: F41.9

## 2012-11-26 NOTE — ED Notes (Signed)
Pt presents with onset of anxiety while at work today.  Pt reports she began to get dizzy and lightheaded.  Pt reports she began breathing rapidly, reports h/o anxiety but is not medicated for same.

## 2012-11-26 NOTE — ED Notes (Signed)
PA at bedside.

## 2012-11-26 NOTE — ED Notes (Signed)
Pt states she has had panic attacks in the past but has not had one in approximately 1 year. Denies vomiting but felt nauseous. Felt light headed at the time. States symptoms have resolved now other than strain in her chest. NAD noted.

## 2012-11-26 NOTE — ED Notes (Signed)
Pt refusing blood test and urine sample. PA notified.

## 2012-11-26 NOTE — ED Provider Notes (Signed)
History     CSN: [id number]  Arrival date & time 11/26/12  1026   First MD Initiated Contact with Patient 11/26/12 1155      No chief complaint on file.   (Consider location/radiation/quality/duration/timing/severity/associated sxs/prior treatment) HPI Patient presents to the emergency department with anxiety attack that occurred just prior to arrival while she was at work.  Patient, states she's had a similar history of anxiety related attacks in the past.  She states that one year ago, was the last time.  Patient, states she does not like to be in really crowded places such as, [employer], or malls.  Patient denies chest pain, shortness of breath, nausea, vomiting, fever, abdominal pain, back pain, headache, blurred vision, or syncope.  Patient, states, when it started she felt dizzy and lightheaded.  Patient, states she did not take anything prior to arrival.  She states she sat down, and tried to calm down.  Patient, states she is feeling better at this time.  She states she wanted to be checked to make sure she was okay   Past Medical History  Diagnosis Date  . GERD (gastroesophageal reflux disease)   . Scoliosis   . IBS (irritable bowel syndrome)   . Anxiety     Past Surgical History  Procedure Laterality Date  . Induced abortion      History reviewed. No pertinent family history.  History  Substance Use Topics  . Smoking status: Never Smoker   . Smokeless tobacco: Not on file  . Alcohol Use: Yes     Comment: occasionally    OB History   Grav Para Term Preterm Abortions TAB SAB Ect Mult Living                  Review of Systems All other systems negative except as documented in the HPI. All pertinent positives and negatives as reviewed in the HPI. Allergies  Amoxicillin and Penicillins  Home Medications   Current Outpatient Rx  Name  Route  Sig  Dispense  Refill  . bismuth subsalicylate (PEPTO BISMOL) 262 MG/15ML suspension   Oral   Take 30 mLs by mouth  every 6 (six) hours as needed for indigestion (acid/ stomach).         . calcium carbonate (TUMS - DOSED IN MG ELEMENTAL CALCIUM) 500 MG chewable tablet   Oral   Chew 2 tablets by mouth every 4 (four) hours as needed for heartburn (for stomach).           BP 127/98  Pulse 72  Temp(Src) 97.8 F (36.6 C) (Oral)  Resp 22  SpO2 100%  LMP 11/25/2012  Physical Exam  Nursing note and vitals reviewed. Constitutional: She is oriented to person, place, and time. She appears well-developed and well-nourished. No distress.  HENT:  Head: Normocephalic and atraumatic.  Mouth/Throat: Oropharynx is clear and moist.  Eyes: Pupils are equal, round, and reactive to light.  Neck: Normal range of motion. Neck supple.  Cardiovascular: Normal rate, regular rhythm and normal heart sounds.  Exam reveals no gallop and no friction rub.   No murmur heard. Pulmonary/Chest: Effort normal and breath sounds normal. No respiratory distress.  Neurological: She is alert and oriented to person, place, and time. Coordination normal.  Skin: Skin is warm and dry.  Psychiatric: Judgment normal. Her mood appears anxious. Her speech is not rapid and/or pressured and not slurred. She is not agitated, not aggressive, not hyperactive, not slowed, not withdrawn, not actively hallucinating and not combative.  Thought content is not paranoid. Cognition and memory are normal. She does not exhibit a depressed mood. She expresses no homicidal and no suicidal ideation. She expresses no suicidal plans and no homicidal plans. She is attentive.    ED Course  Procedures (including critical care time)   Patient refused.  I-STAT chem 8, and pregnancy test.  Patient is advised that we will give her resources for followup.  Told to return here as needed.  Patient is not in any distress on my exam, and appears stable. MDM          Carlyle Dolly, PA-C 11/26/12 1230

## 2012-11-27 NOTE — ED Provider Notes (Signed)
Medical screening examination/treatment/procedure(s) were performed by non-physician practitioner and as supervising physician I was immediately available for consultation/collaboration.   Nelia Shi, MD 11/27/12 [phone]

## 2013-02-09 ENCOUNTER — Emergency Department (HOSPITAL_COMMUNITY)
Admission: EM | Admit: 2013-02-09 | Discharge: 2013-02-09 | Disposition: A | Discharge status: Home / Self Care | Payer: Self-pay | Attending: Emergency Medicine | Admitting: Emergency Medicine

## 2013-02-09 ENCOUNTER — Encounter (HOSPITAL_COMMUNITY): Payer: Self-pay

## 2013-02-09 DIAGNOSIS — R109 Unspecified abdominal pain: Secondary | ICD-10-CM | POA: Insufficient documentation

## 2013-02-09 DIAGNOSIS — Z3202 Encounter for pregnancy test, result negative: Secondary | ICD-10-CM | POA: Insufficient documentation

## 2013-02-09 DIAGNOSIS — Z880 Allergy status to penicillin: Secondary | ICD-10-CM | POA: Insufficient documentation

## 2013-02-09 DIAGNOSIS — N390 Urinary tract infection, site not specified: Secondary | ICD-10-CM | POA: Insufficient documentation

## 2013-02-09 DIAGNOSIS — R110 Nausea: Secondary | ICD-10-CM | POA: Insufficient documentation

## 2013-02-09 DIAGNOSIS — K589 Irritable bowel syndrome without diarrhea: Secondary | ICD-10-CM | POA: Insufficient documentation

## 2013-02-09 DIAGNOSIS — Z8719 Personal history of other diseases of the digestive system: Secondary | ICD-10-CM | POA: Insufficient documentation

## 2013-02-09 DIAGNOSIS — Z8659 Personal history of other mental and behavioral disorders: Secondary | ICD-10-CM | POA: Insufficient documentation

## 2013-02-09 LAB — URINE MICROSCOPIC-ADD ON

## 2013-02-09 LAB — CBC WITH DIFFERENTIAL/PLATELET
Eosinophils Relative: 2 % (ref 0–5)
Lymphocytes Relative: 38 % (ref 12–46)
Lymphs Abs: 3.2 10*3/uL (ref 0.7–4.0)
MCV: 93 fL (ref 78.0–100.0)
Neutrophils Relative %: 52 % (ref 43–77)
Platelets: 204 10*3/uL (ref 150–400)
RBC: 4.3 MIL/uL (ref 3.87–5.11)
WBC: 8.4 10*3/uL (ref 4.0–10.5)

## 2013-02-09 LAB — COMPREHENSIVE METABOLIC PANEL
ALT: 5 U/L (ref 0–35)
Alkaline Phosphatase: 70 U/L (ref 39–117)
CO2: 23 mEq/L (ref 19–32)
Chloride: 101 mEq/L (ref 96–112)
GFR calc Af Amer: >90 mL/min (ref >90)
GFR calc non Af Amer: >90 mL/min (ref >90)
Glucose, Bld: 84 mg/dL (ref 70–99)
Potassium: 3.7 mEq/L (ref 3.5–5.1)
Sodium: 136 mEq/L (ref 135–145)
Total Bilirubin: 0.5 mg/dL (ref 0.3–1.2)

## 2013-02-09 LAB — URINALYSIS, ROUTINE W REFLEX MICROSCOPIC
Bilirubin Urine: NEGATIVE
Hgb urine dipstick: NEGATIVE
Protein, ur: NEGATIVE mg/dL
Urobilinogen, UA: 0.2 mg/dL (ref 0.0–1.0)

## 2013-02-09 MED ORDER — SODIUM CHLORIDE 0.9 % IV SOLN: 1000.0000 mL | INTRAVENOUS | Status: DC

## 2013-02-09 MED ORDER — ONDANSETRON HCL 4 MG/2ML IJ SOLN
4.0000 mg | Freq: Once | INTRAMUSCULAR | Status: AC
  Administered 2013-02-09: 4 mg via INTRAVENOUS
  Filled 2013-02-09: qty 2

## 2013-02-09 MED ORDER — CEPHALEXIN 500 MG PO CAPS: 500.0000 mg | ORAL_CAPSULE | Freq: Four times a day (QID) | ORAL | Status: DC

## 2013-02-09 MED ORDER — SODIUM CHLORIDE 0.9 % IV SOLN
1000.0000 mL | Freq: Once | INTRAVENOUS | Status: AC
  Administered 2013-02-09: 1000 mL via INTRAVENOUS

## 2013-02-09 MED ORDER — CEPHALEXIN 500 MG PO CAPS
1000.0000 mg | ORAL_CAPSULE | Freq: Once | ORAL | Status: AC
  Administered 2013-02-09: 1000 mg via ORAL
  Filled 2013-02-09: qty 2

## 2013-02-09 NOTE — ED Notes (Signed)
Pt states became nauseated yesterday at around 9:30 am.  Pt then began having bilateral abdominal pain.  No fever.  Nausea, no vomiting.  No problems with urination.

## 2013-02-09 NOTE — ED Provider Notes (Signed)
CSN: [id number]     Arrival date & time 02/09/13  0447 History   First MD Initiated Contact with Patient 02/09/13 0510     Chief Complaint  Patient presents with  . Abdominal Pain  . Nausea   (Consider location/radiation/quality/duration/timing/severity/associated sxs/prior Treatment) Patient is a 27 y.o. female presenting with abdominal pain. The history is provided by the patient.  Abdominal Pain She noted onset yesterday morning of crampy upper abdominal pain. Pain is present on both sides. It will come in spasms the last anywhere from 3-5 minutes before subsiding. There is associated nausea but no vomiting. She denies constipation or diarrhea. Pain is moderately severe and she rates it at 8/10 when present. It seems to be better with manual massage of her abdomen but nothing makes it worse. She denies fever, chills, sweats. She denies urinary urgency, frequency, tenesmus, dysuria. She's not using any contraception and states her last menstrual period was sometime in July. She does have a history of irritable bowel syndrome but states that pain from that is usually in the lower abdomen and not in the upper abdomen.  Past Medical History  Diagnosis Date  . GERD (gastroesophageal reflux disease)   . Scoliosis   . IBS (irritable bowel syndrome)   . Anxiety    Past Surgical History  Procedure Laterality Date  . Induced abortion     History reviewed. No pertinent family history. History  Substance Use Topics  . Smoking status: Never Smoker   . Smokeless tobacco: Not on file  . Alcohol Use: Yes     Comment: occasionally   OB History   Grav Para Term Preterm Abortions TAB SAB Ect Mult Living                 Review of Systems  Gastrointestinal: Positive for abdominal pain.  All other systems reviewed and are negative.    Allergies  Amoxicillin and Penicillins  Home Medications   Current Outpatient Rx  Name  Route  Sig  Dispense  Refill  . bismuth subsalicylate (PEPTO  BISMOL) 262 MG/15ML suspension   Oral   Take 30 mLs by mouth every 6 (six) hours as needed for indigestion (acid/ stomach).         . calcium carbonate (TUMS - DOSED IN MG ELEMENTAL CALCIUM) 500 MG chewable tablet   Oral   Chew 2 tablets by mouth every 4 (four) hours as needed for heartburn (for stomach).          BP 112/68  Pulse 71  Temp(Src) 98.3 F (36.8 C) (Oral)  Resp 18  SpO2 98%  LMP 12/20/2012 Physical Exam  Nursing note and vitals reviewed.  27 year old female, resting comfortably and in no acute distress. Vital signs are normal. Oxygen saturation is 98%, which is normal. Head is normocephalic and atraumatic. PERRLA, EOMI. Oropharynx is clear. Neck is nontender and supple without adenopathy or JVD. Back is nontender and there is no CVA tenderness. Lungs are clear without rales, wheezes, or rhonchi. Chest is nontender. Heart has regular rate and rhythm without murmur. Abdomen is soft, flat, with mild tenderness in the right upper quadrant, left upper quadrant, and epigastric area. There is no rebound or guarding. There are no masses or hepatosplenomegaly and peristalsis is normoactive. Extremities have no cyanosis or edema, full range of motion is present. Skin is warm and dry without rash. Neurologic: Mental status is normal, cranial nerves are intact, there are no motor or sensory deficits.  ED Course  Procedures (including critical care time) Results for orders placed during the hospital encounter of 02/09/13  CBC WITH DIFFERENTIAL      Result Value Range   WBC 8.4  4.0 - 10.5 K/uL   RBC 4.30  3.87 - 5.11 MIL/uL   Hemoglobin 13.8  12.0 - 15.0 g/dL   HCT 81.1  91.4 - 78.2 %   MCV 93.0  78.0 - 100.0 fL   MCH 32.1  26.0 - 34.0 pg   MCHC 34.5  30.0 - 36.0 g/dL   RDW 95.6  21.3 - 08.6 %   Platelets 204  150 - 400 K/uL   Neutrophils Relative % 52  43 - 77 %   Neutro Abs 4.4  1.7 - 7.7 K/uL   Lymphocytes Relative 38  12 - 46 %   Lymphs Abs 3.2  0.7 - 4.0 K/uL    Monocytes Relative 8  3 - 12 %   Monocytes Absolute 0.7  0.1 - 1.0 K/uL   Eosinophils Relative 2  0 - 5 %   Eosinophils Absolute 0.2  0.0 - 0.7 K/uL   Basophils Relative 0  0 - 1 %   Basophils Absolute 0.0  0.0 - 0.1 K/uL  COMPREHENSIVE METABOLIC PANEL      Result Value Range   Sodium 136  135 - 145 mEq/L   Potassium 3.7  3.5 - 5.1 mEq/L   Chloride 101  96 - 112 mEq/L   CO2 23  19 - 32 mEq/L   Glucose, Bld 84  70 - 99 mg/dL   BUN 10  6 - 23 mg/dL   Creatinine, Ser 5.78  0.50 - 1.10 mg/dL   Calcium 9.3  8.4 - 46.9 mg/dL   Total Protein 7.4  6.0 - 8.3 g/dL   Albumin 4.1  3.5 - 5.2 g/dL   AST 14  0 - 37 U/L   ALT 5  0 - 35 U/L   Alkaline Phosphatase 70  39 - 117 U/L   Total Bilirubin 0.5  0.3 - 1.2 mg/dL   GFR calc non Af Amer >90  >90 mL/min   GFR calc Af Amer >90  >90 mL/min  LIPASE, BLOOD      Result Value Range   Lipase 42  11 - 59 U/L  URINALYSIS, ROUTINE W REFLEX MICROSCOPIC      Result Value Range   Color, Urine YELLOW  YELLOW   APPearance CLOUDY (*) CLEAR   Specific Gravity, Urine 1.025  1.005 - 1.030   pH 6.5  5.0 - 8.0   Glucose, UA NEGATIVE  NEGATIVE mg/dL   Hgb urine dipstick NEGATIVE  NEGATIVE   Bilirubin Urine NEGATIVE  NEGATIVE   Ketones, ur NEGATIVE  NEGATIVE mg/dL   Protein, ur NEGATIVE  NEGATIVE mg/dL   Urobilinogen, UA 0.2  0.0 - 1.0 mg/dL   Nitrite NEGATIVE  NEGATIVE   Leukocytes, UA MODERATE (*) NEGATIVE  URINE MICROSCOPIC-ADD ON      Result Value Range   Squamous Epithelial / LPF RARE  RARE   WBC, UA 7-10  <3 WBC/hpf   RBC / HPF 0-2  <3 RBC/hpf   Bacteria, UA MANY (*) RARE  POCT PREGNANCY, URINE      Result Value Range   Preg Test, Ur NEGATIVE  NEGATIVE     MDM   1. Abdominal pain   2. Irritable bowel syndrome   3. Urinary tract infection    Abdominal pain which is most likely exacerbation of her  known irritable bowel syndrome. Screening labs will be obtained in pregnancy test obtained.  Workup is significant only for evidence of  urinary tract infection. She is discharged with a prescription for cephalexin.  Dione Booze, MD 02/10/13 [phone]

## 2013-02-10 LAB — URINE CULTURE

## 2013-09-30 ENCOUNTER — Ambulatory Visit (INDEPENDENT_AMBULATORY_CARE_PROVIDER_SITE_OTHER): Payer: Self-pay | Admitting: Otolaryngology

## 2014-01-19 ENCOUNTER — Encounter: Payer: Self-pay | Admitting: Obstetrics & Gynecology

## 2014-01-19 DIAGNOSIS — Z3480 Encounter for supervision of other normal pregnancy, unspecified trimester: Secondary | ICD-10-CM

## 2014-06-17 NOTE — L&D Delivery Note (Cosign Needed)
Delivery Note At 8:05 PM a viable female was delivered via Vaginal, Spontaneous Delivery (Presentation: Left Occiput Anterior).  APGAR: 9, 10; weight  pending.  After 3 minutes, the cord was clamped and cut. Cord blood was collected for banking. 40 units of pitocin diluted in 1000cc LR was infused rapidly IV.  The placenta separated spontaneously and delivered via CCT and maternal pushing effort.  It was inspected and appears to be intact with a 3 VC.    Anesthesia: Epidural  Episiotomy: None Lacerations: 1st degree Suture Repair: 3.0 vicryl Est. Blood Loss (mL): 50  Mom to postpartum.  Baby to Couplet care / Skin to Skin.  CRESENZO-DISHMAN,Lamel Mccarley 04/20/2015, 9:04 PM

## 2014-07-19 ENCOUNTER — Emergency Department (HOSPITAL_COMMUNITY): Payer: BLUE CROSS/BLUE SHIELD

## 2014-07-19 ENCOUNTER — Emergency Department (HOSPITAL_COMMUNITY)
Admission: EM | Admit: 2014-07-19 | Discharge: 2014-07-19 | Disposition: A | Discharge status: Home / Self Care | Payer: BLUE CROSS/BLUE SHIELD | Attending: Emergency Medicine | Admitting: Emergency Medicine

## 2014-07-19 ENCOUNTER — Encounter (HOSPITAL_COMMUNITY): Payer: Self-pay | Admitting: Emergency Medicine

## 2014-07-19 DIAGNOSIS — R0602 Shortness of breath: Secondary | ICD-10-CM | POA: Diagnosis present

## 2014-07-19 DIAGNOSIS — F419 Anxiety disorder, unspecified: Secondary | ICD-10-CM | POA: Diagnosis not present

## 2014-07-19 DIAGNOSIS — Z79899 Other long term (current) drug therapy: Secondary | ICD-10-CM | POA: Diagnosis not present

## 2014-07-19 DIAGNOSIS — Z880 Allergy status to penicillin: Secondary | ICD-10-CM | POA: Diagnosis not present

## 2014-07-19 DIAGNOSIS — Z8739 Personal history of other diseases of the musculoskeletal system and connective tissue: Secondary | ICD-10-CM | POA: Insufficient documentation

## 2014-07-19 DIAGNOSIS — Z8719 Personal history of other diseases of the digestive system: Secondary | ICD-10-CM | POA: Insufficient documentation

## 2014-07-19 DIAGNOSIS — R002 Palpitations: Secondary | ICD-10-CM | POA: Diagnosis not present

## 2014-07-19 DIAGNOSIS — Z3202 Encounter for pregnancy test, result negative: Secondary | ICD-10-CM | POA: Diagnosis not present

## 2014-07-19 LAB — PREGNANCY, URINE: PREG TEST UR: NEGATIVE

## 2014-07-19 LAB — BASIC METABOLIC PANEL
Anion gap: 7 (ref 5–15)
BUN: 10 mg/dL (ref 6–23)
CALCIUM: 8.8 mg/dL (ref 8.4–10.5)
CHLORIDE: 106 mmol/L (ref 96–112)
CO2: 26 mmol/L (ref 19–32)
Creatinine, Ser: 0.67 mg/dL (ref 0.50–1.10)
GFR calc Af Amer: >90 mL/min (ref >90)
GFR calc non Af Amer: >90 mL/min (ref >90)
GLUCOSE: 107 mg/dL — AB (ref 70–99)
POTASSIUM: 3.3 mmol/L — AB (ref 3.5–5.1)
Sodium: 139 mmol/L (ref 135–145)

## 2014-07-19 LAB — CBC WITH DIFFERENTIAL/PLATELET
Basophils Absolute: 0 10*3/uL (ref 0.0–0.1)
Basophils Relative: 0 % (ref 0–1)
Eosinophils Absolute: 0.1 10*3/uL (ref 0.0–0.7)
Eosinophils Relative: 2 % (ref 0–5)
HEMATOCRIT: 38.5 % (ref 36.0–46.0)
Hemoglobin: 12.8 g/dL (ref 12.0–15.0)
LYMPHS PCT: 29 % (ref 12–46)
Lymphs Abs: 2.1 10*3/uL (ref 0.7–4.0)
MCH: 30.7 pg (ref 26.0–34.0)
MCHC: 33.2 g/dL (ref 30.0–36.0)
MCV: 92.3 fL (ref 78.0–100.0)
MONO ABS: 0.7 10*3/uL (ref 0.1–1.0)
MONOS PCT: 9 % (ref 3–12)
NEUTROS PCT: 60 % (ref 43–77)
Neutro Abs: 4.3 10*3/uL (ref 1.7–7.7)
PLATELETS: 223 10*3/uL (ref 150–400)
RBC: 4.17 MIL/uL (ref 3.87–5.11)
RDW: 12.7 % (ref 11.5–15.5)
WBC: 7.3 10*3/uL (ref 4.0–10.5)

## 2014-07-19 LAB — I-STAT TROPONIN, ED: TROPONIN I, POC: 0.01 ng/mL (ref 0.00–0.08)

## 2014-07-19 LAB — D-DIMER, QUANTITATIVE (NOT AT ARMC): D DIMER QUANT: 0.35 ug{FEU}/mL (ref 0.00–0.48)

## 2014-07-19 MED ORDER — HYDROXYZINE HCL 10 MG PO TABS: 10.0000 mg | ORAL_TABLET | Freq: Four times a day (QID) | ORAL | Status: DC | PRN

## 2014-07-19 MED ORDER — HYDROXYZINE HCL 10 MG PO TABS
10.0000 mg | ORAL_TABLET | Freq: Once | ORAL | Status: AC
  Administered 2014-07-19: 10 mg via ORAL
  Filled 2014-07-19: qty 1

## 2014-07-19 NOTE — ED Notes (Signed)
PA back at the bedside.  

## 2014-07-19 NOTE — ED Provider Notes (Signed)
CSN: [id number]     Arrival date & time 07/19/14  1208 History   First MD Initiated Contact with Patient 07/19/14 1236     Chief Complaint  Patient presents with  . Shortness of Breath   Bethany Odonnell is a 29 y.o. female with a history of anxiety, and GERD who presents to the ED complaining of sudden onset of shortness of breath and heart palpitations just prior to arrival. Patient reports she was driving when she started having heart palpitations and shortness of breath and felt like she might pass out. She then reports onset of anxiety. She also reports nausea without vomiting. She reports history of anxiety but states that this feels different. She denies pain. She has attempted no treatments today. She reports a mother with a history of DVTs. She reports smoking 2 cigarettes per week. The patient denies chest pain, fevers, chills, cough, wheezing, leg pain, leg swelling, recent long travel, numbness, tingling, dysuria or hematuria. She denies personal history of DVTs or PEs. She denies recent surgery or long travel. She denies exogenous estrogen use.  (Consider location/radiation/quality/duration/timing/severity/associated sxs/prior Treatment) HPI  Past Medical History  Diagnosis Date  . GERD (gastroesophageal reflux disease)   . Scoliosis   . IBS (irritable bowel syndrome)   . Anxiety    Past Surgical History  Procedure Laterality Date  . Induced abortion     History reviewed. No pertinent family history. History  Substance Use Topics  . Smoking status: Never Smoker   . Smokeless tobacco: Not on file  . Alcohol Use: Yes     Comment: occasionally   OB History    No data available     Review of Systems  Constitutional: Negative for fever and chills.  HENT: Negative for congestion, ear pain and sore throat.   Eyes: Negative for pain and visual disturbance.  Respiratory: Positive for shortness of breath. Negative for cough, chest tightness and wheezing.   Cardiovascular:  Positive for palpitations. Negative for chest pain and leg swelling.  Gastrointestinal: Positive for nausea. Negative for vomiting, abdominal pain and diarrhea.  Genitourinary: Negative for dysuria and hematuria.  Musculoskeletal: Negative for back pain and neck pain.  Skin: Negative for rash.  Neurological: Positive for light-headedness. Negative for dizziness, syncope, weakness and numbness.  Psychiatric/Behavioral: The patient is nervous/anxious.       Allergies  Amoxicillin and Penicillins  Home Medications   Prior to Admission medications   Medication Sig Start Date End Date Taking? Authorizing Provider  bismuth subsalicylate (PEPTO BISMOL) 262 MG/15ML suspension Take 30 mLs by mouth every 6 (six) hours as needed for indigestion (acid/ stomach).    Historical Provider, MD  calcium carbonate (TUMS - DOSED IN MG ELEMENTAL CALCIUM) 500 MG chewable tablet Chew 2 tablets by mouth every 4 (four) hours as needed for heartburn (for stomach).    Historical Provider, MD  cephALEXin (KEFLEX) 500 MG capsule Take 1 capsule (500 mg total) by mouth 4 (four) times daily. 5/80/99   Delora Fuel, MD  hydrOXYzine (ATARAX/VISTARIL) 10 MG tablet Take 1 tablet (10 mg total) by mouth every 6 (six) hours as needed for anxiety. 07/19/14   Verda Cumins Oryon Gary, PA-C   BP 106/61 mmHg  Pulse 91  Temp(Src) 98.4 F (36.9 C) (Oral)  Resp 19  SpO2 99%  LMP 06/22/2014 Physical Exam  Constitutional: She is oriented to person, place, and time. She appears well-developed and well-nourished. No distress.  HENT:  Head: Normocephalic and atraumatic.  Mouth/Throat: Oropharynx is clear  and moist. No oropharyngeal exudate.  Eyes: Conjunctivae are normal. Pupils are equal, round, and reactive to light. Right eye exhibits no discharge. Left eye exhibits no discharge.  Neck: Neck supple.  Cardiovascular: Regular rhythm, normal heart sounds and intact distal pulses.  Exam reveals no gallop and no friction rub.   No  murmur heard. Tachycardic at 120. Bilateral radial pulses are intact. Bilateral posterior tibialis pulses intact.  Pulmonary/Chest: Effort normal and breath sounds normal. No respiratory distress. She has no wheezes. She has no rales.  Abdominal: Soft. Bowel sounds are normal. She exhibits no distension. There is no tenderness.  Abdomen is soft and nontender to palpation.  Musculoskeletal: She exhibits no edema.  Lymphadenopathy:    She has no cervical adenopathy.  Neurological: She is alert and oriented to person, place, and time. Coordination normal.  Skin: Skin is warm and dry. No rash noted. She is not diaphoretic. No erythema. No pallor.  Psychiatric: Her behavior is normal. Her mood appears anxious.  Patient appears anxious.  Nursing note and vitals reviewed.   ED Course  Procedures (including critical care time) Labs Review Labs Reviewed  BASIC METABOLIC PANEL - Abnormal; Notable for the following:    Potassium 3.3 (*)    Glucose, Bld 107 (*)    All other components within normal limits  CBC WITH DIFFERENTIAL/PLATELET  D-DIMER, QUANTITATIVE  PREGNANCY, URINE  I-STAT TROPOININ, ED    Imaging Review Dg Chest 2 View  07/19/2014   CLINICAL DATA:  Shortness of breath, anxiety, palpitations  EXAM: CHEST  2 VIEW  COMPARISON:  PA and lateral chest x-ray of July 07, 2010  FINDINGS: The lungs are well-expanded. There is no focal infiltrate. There is no pleural effusion or pneumothorax. The heart and pulmonary vascular at merit year normal. The trachea is midline. There is stable mild dextro curvature of the mid thoracic spine.  IMPRESSION: There is no active cardiopulmonary disease.   Electronically Signed   By: David  Martinique   On: 07/19/2014 14:00     EKG Interpretation   Date/Time:  Tuesday July 19 2014 12:15:23 EST Ventricular Rate:  98 PR Interval:  124 QRS Duration: 84 QT Interval:  328 QTC Calculation: 418 R Axis:   77 Text Interpretation:  Normal sinus rhythm  Normal ECG Confirmed by KOHUT   MD, STEPHEN ([id number]) on 07/19/2014 1:29:30 PM      Filed Vitals:   07/19/14 1430 07/19/14 1445 07/19/14 1500 07/19/14 1515  BP: 115/60 111/56 103/58 106/61  Pulse: 106 93 92 91  Temp:      TempSrc:      Resp: 29 22 19 19   SpO2: 100% 100% 99% 99%     MDM   Meds given in ED:  Medications  hydrOXYzine (ATARAX/VISTARIL) tablet 10 mg (10 mg Oral Given 07/19/14 1340)    Discharge Medication List as of 07/19/2014  2:59 PM    START taking these medications   Details  hydrOXYzine (ATARAX/VISTARIL) 10 MG tablet Take 1 tablet (10 mg total) by mouth every 6 (six) hours as needed for anxiety., Starting 07/19/2014, Until Discontinued, Print        Final diagnoses:  Anxiety  Shortness of breath  Intermittent palpitations   This is a 29 y.o. female with a history of anxiety, and GERD who presents to the ED complaining of sudden onset of shortness of breath and heart palpitations just prior to arrival. She reports onset of anxiety after this.  Patient reports history of anxiety and reports  this feels more like her heart is racing. Patient is afebrile and nontoxic appearing. During my exam the patient's heart rate ranged from 90-136. Patient appears anxious. The patient also reports a family history of DVTs. Zosyn occasional smoker. She denies any cough or wheezing. Patient sudden onset of tachycardia will check d-dimer. The patient's troponin is negative. She has a negative urine pregnancy test. BMP and CBC are unremarkable. D-dimer is negative. Patient reports feeling improved with Vistaril in the ED. She is no longer tachycardic at my reevaluation. We'll discharge the patient with Vistaril for anxiety and have her follow-up with her primary care provider. Strict return precautions provided. I advised the patient to follow-up with their primary care provider this week. I advised the patient to return to the emergency department with new or worsening symptoms or new  concerns. The patient verbalized understanding and agreement with plan.   This patient was discussed with Dr. Wilson Singer who agrees with assessment and plan.    Hanley Hays, PA-C 07/19/14 [place], MD 07/20/14 [phone]

## 2014-07-19 NOTE — Discharge Instructions (Signed)
Generalized Anxiety Disorder Generalized anxiety disorder (GAD) is a mental disorder. It interferes with life functions, including relationships, work, and school. GAD is different from normal anxiety, which everyone experiences at some point in their lives in response to specific life events and activities. Normal anxiety actually helps [place] prepare for and get through these life events and activities. Normal anxiety goes away after the event or activity is over.  GAD causes anxiety that is not necessarily related to specific events or activities. It also causes excess anxiety in proportion to specific events or activities. The anxiety associated with GAD is also difficult to control. GAD can vary from mild to severe. People with severe GAD can have intense waves of anxiety with physical symptoms (panic attacks).  SYMPTOMS The anxiety and worry associated with GAD are difficult to control. This anxiety and worry are related to many life events and activities and also occur more days than not for 6 months or longer. People with GAD also have three or more of the following symptoms (one or more in children):  Restlessness.   Fatigue.  Difficulty concentrating.   Irritability.  Muscle tension.  Difficulty sleeping or unsatisfying sleep. DIAGNOSIS GAD is diagnosed through an assessment by your health care provider. Your health care provider will ask you questions aboutyour mood,physical symptoms, and events in your life. Your health care provider may ask you about your medical history and use of alcohol or drugs, including prescription medicines. Your health care provider may also do a physical exam and blood tests. Certain medical conditions and the use of certain substances can cause symptoms similar to those associated with GAD. Your health care provider may refer you to a mental health specialist for further evaluation. TREATMENT The following therapies are usually used to treat GAD:    Medication. Antidepressant medication usually is prescribed for long-term daily control. Antianxiety medicines may be added in severe cases, especially when panic attacks occur.   Talk therapy (psychotherapy). Certain types of talk therapy can be helpful in treating GAD by providing support, education, and guidance. A form of talk therapy called cognitive behavioral therapy can teach you healthy ways to think about and react to daily life events and activities.  Stress managementtechniques. These include yoga, meditation, and exercise and can be very helpful when they are practiced regularly. A mental health specialist can help determine which treatment is best for you. Some people see improvement with one therapy. However, other people require a combination of therapies. Document Released: 09/28/2012 Document Revised: 10/18/2013 Document Reviewed: 09/28/2012 [facility] Patient Information 2015 [place], [place]. This information is not intended to replace advice given to you by your health care provider. Make sure you discuss any questions you have with your health care provider. Palpitations A palpitation is the feeling that your heartbeat is irregular or is faster than normal. It may feel like your heart is fluttering or skipping a beat. Palpitations are usually not a serious problem. However, in some cases, you may need further medical evaluation. CAUSES  Palpitations can be caused by:  Smoking.  Caffeine or other stimulants, such as diet pills or energy drinks.  Alcohol.  Stress and anxiety.  Strenuous physical activity.  Fatigue.  Certain medicines.  Heart disease, especially if you have a history of irregular heart rhythms (arrhythmias), such as atrial fibrillation, atrial flutter, or supraventricular tachycardia.  An improperly working pacemaker or defibrillator. DIAGNOSIS  To find the cause of your palpitations, your health care provider will take your medical history and  perform a physical exam. Your health care provider may also have you take a test called an ambulatory electrocardiogram (ECG). An ECG records your heartbeat patterns over a 24-hour period. You may also have other tests, such as:  Transthoracic echocardiogram (TTE). During echocardiography, sound waves are used to evaluate how blood flows through your heart.  Transesophageal echocardiogram (TEE).  Cardiac monitoring. This allows your health care provider to monitor your heart rate and rhythm in real time.  Holter monitor. This is a portable device that records your heartbeat and can help diagnose heart arrhythmias. It allows your health care provider to track your heart activity for several days, if needed.  Stress tests by exercise or by giving medicine that makes the heart beat faster. TREATMENT  Treatment of palpitations depends on the cause of your symptoms and can vary greatly. Most cases of palpitations do not require any treatment other than time, relaxation, and monitoring your symptoms. Other causes, such as atrial fibrillation, atrial flutter, or supraventricular tachycardia, usually require further treatment. HOME CARE INSTRUCTIONS   Avoid:  Caffeinated coffee, tea, soft drinks, diet pills, and energy drinks.  Chocolate.  Alcohol.  Stop smoking if you smoke.  Reduce your stress and anxiety. Things that can help you relax include:  A method of controlling things in your body, such as your heartbeats, with your mind (biofeedback).  Yoga.  Meditation.  Physical activity such as swimming, jogging, or walking.  Get plenty of rest and sleep. SEEK MEDICAL CARE IF:   You continue to have a fast or irregular heartbeat beyond 24 hours.  Your palpitations occur more often. SEEK IMMEDIATE MEDICAL CARE IF:  You have chest pain or shortness of breath.  You have a severe headache.  You feel dizzy or you faint. MAKE SURE YOU:  Understand these instructions.  Will watch  your condition.  Will get help right away if you are not doing well or get worse. Document Released: 05/31/2000 Document Revised: 06/08/2013 Document Reviewed: 08/02/2011 [facility] Patient Information 2015 [place], [place]. This information is not intended to replace advice given to you by your health care provider. Make sure you discuss any questions you have with your health care provider. Shortness of Breath Shortness of breath means you have trouble breathing. It could also mean that you have a medical problem. You should get immediate medical care for shortness of breath. CAUSES   Not enough oxygen in the air such as with high altitudes or a smoke-filled room.  Certain lung diseases, infections, or problems.  Heart disease or conditions, such as angina or heart failure.  Low red blood cells (anemia).  Poor physical fitness, which can cause shortness of breath when you exercise.  Chest or back injuries or stiffness.  Being overweight.  Smoking.  Anxiety, which can make you feel like you are not getting enough air. DIAGNOSIS  Serious medical problems can often be found during your physical exam. Tests may also be done to determine why you are having shortness of breath. Tests may include:  Chest X-rays.  Lung function tests.  Blood tests.  An electrocardiogram (ECG).  An ambulatory electrocardiogram. An ambulatory ECG records your heartbeat patterns over a 24-hour period.  Exercise testing.  A transthoracic echocardiogram (TTE). During echocardiography, sound waves are used to evaluate how blood flows through your heart.  A transesophageal echocardiogram (TEE).  Imaging scans. Your health care provider may not be able to find a cause for your shortness of breath after your exam. In this case,  it is important to have a follow-up exam with your health care provider as directed.  TREATMENT  Treatment for shortness of breath depends on the cause of your symptoms and can  vary greatly. HOME CARE INSTRUCTIONS   Do not smoke. Smoking is a common cause of shortness of breath. If you smoke, ask for help to quit.  Avoid being around chemicals or things that may bother your breathing, such as paint fumes and dust.  Rest as needed. Slowly resume your usual activities.  If medicines were prescribed, take them as directed for the full length of time directed. This includes oxygen and any inhaled medicines.  Keep all follow-up appointments as directed by your health care provider. SEEK MEDICAL CARE IF:   Your condition does not improve in the time expected.  You have a hard time doing your normal activities even with rest.  You have any new symptoms. SEEK IMMEDIATE MEDICAL CARE IF:   Your shortness of breath gets worse.  You feel light-headed, faint, or develop a cough not controlled with medicines.  You start coughing up blood.  You have pain with breathing.  You have chest pain or pain in your arms, shoulders, or abdomen.  You have a fever.  You are unable to walk up stairs or exercise the way you normally do. MAKE SURE YOU:  Understand these instructions.  Will watch your condition.  Will get help right away if you are not doing well or get worse. Document Released: 02/26/2001 Document Revised: 06/08/2013 Document Reviewed: 08/19/2011 [facility] Patient Information 2015 [place], [place]. This information is not intended to replace advice given to you by your health care provider. Make sure you discuss any questions you have with your health care provider.   Emergency Department Resource Guide 1) Find a Doctor and Pay Out of Pocket Although you won't have to find out who is covered by your insurance plan, it is a good idea to ask around and get recommendations. You will then need to call the office and see if the doctor you have chosen will accept you as a new patient and what types of options they offer for patients who are self-pay. Some doctors  offer discounts or will set up payment plans for their patients who do not have insurance, but you will need to ask so you aren't surprised when you get to your appointment.  2) Contact Your Local Health Department Not all health departments have doctors that can see patients for sick visits, but many do, so it is worth a call to see if yours does. If you don't know where your local health department is, you can check in your phone book. The CDC also has a tool to help you locate your state's health department, and many state websites also have listings of all of their local health departments.  3) Find a Central Clinic If your illness is not likely to be very severe or complicated, you may want to try a walk in clinic. These are popping up all over the country in pharmacies, drugstores, and shopping centers. They're usually staffed by nurse practitioners or physician assistants that have been trained to treat common illnesses and complaints. They're usually fairly quick and inexpensive. However, if you have serious medical issues or chronic medical problems, these are probably not your best option.  No Primary Care Doctor: - Call Health Connect at  [phone] - they can help you locate a primary care doctor that  accepts your insurance, provides certain  services, etc. - Physician Referral Service- [phone]  Chronic Pain Problems: Organization         Address  Phone   Notes  [facility]  [phone] Patients need to be referred by their primary care doctor.   Medication Assistance: Organization         Address  Phone   Notes  [facility] Medication [facility] [address] [place], [place] [address] [phone] --Must be a resident of [facility] -- Must have NO insurance coverage whatsoever (no Medicaid/ Medicare, etc.) -- The pt. MUST have a primary care doctor that directs their care regularly and follows them in the community    MedAssist  [phone]   [employer]  [phone]    Agencies that provide inexpensive medical care: Organization         Address  Phone   Notes  [place]  [phone]   [facility]    [phone]   [facility] [place], [place] [address] [phone]   [place] [address] [place] [phone]   Planned Parenthood    [phone]   [facility]    [phone]   [place] and [place] [address], [place] Phone:  [phone], Fax:  [phone] Hours of Operation:  9 am - 6 pm, M-F.  Also accepts Medicaid/Medicare and self-pay.  [facility] for [address]a [address], [address], [place] Phone: [phone], Fax: [phone]. Hours of Operation:  8:30 am - 5:30 pm, M-F.  Also accepts Medicaid and self-pay.  [facility] [place] [address], [place] Phone: [phone]   [place], [place], [place] [phone], Ext. [phone] Mondays & Thursdays: 7-9 AM.  First 15 patients are seen on a first come, first serve basis.    [place] Providers:  Organization         Address  Phone   Notes  [facility] [address],  [phone] Also accepts self-pay patients.  [facility] [address], [place]  [phone]   [place], [address], [place] [phone]   [facility] [address] [place] [phone]   Lucianne Lei [address], [place]   [phone] Only accepts [place] Access [place] patients after they have their name applied to their card.   Self-Pay (no insurance) in [facility]:  Organization         Address  Phone   Notes  Sickle Cell Patients, [facility] [place] [phone]   [facility] [place] [phone]   [facility] [place]  [employer], [place], [place] [phone]   [facility]/Dr. Osei-Bonsu  [address] [place] or [address], [place] [phone] Phone number for both [place] and [place] locations is the same.  [facility] [address] [place] [phone]   [facility] [address] [place] or [address] [phone]   [facility] [address],  [place] [phone], phone; [phone], fax Sees patients 1st and 3rd Saturday of every month.  Must not qualify for public or private insurance (i.e. Medicaid, Medicare, [place] Health Choice, Veterans' Benefits)  Household income should be no more than 200% of the poverty level The clinic cannot treat you if you are pregnant or think you are pregnant  Sexually transmitted diseases are not treated at the clinic.    Dental Care: Organization         Address  Phone  Notes  [facility] [place] [phone] Accepts children up to age 52 who are enrolled in [place] or [place]; pregnant women with a Medicaid card; and children who have applied for Medicaid or [place] Health Choice, but were declined, whose parents can pay a reduced fee at time of service.  [facility]  [address], [place] [phone] Accepts children up to age 73 who are enrolled in [place] or [place]; pregnant women with a Medicaid card; and children who have applied for Medicaid or [place] Health Choice, but were declined, whose parents can pay a reduced fee at time of service.  [place] Adult Dental Access PROGRAM  [place] [phone] Patients are seen by appointment only. Walk-ins are not accepted. [place] will see patients 52 years of age and older. Monday - Tuesday (8am-5pm) Most Wednesdays (8:30-5pm) $30 per visit, cash only  [facility] Adult Dental Access PROGRAM  [address], [facility] [phone] Patients are seen by appointment only. Walk-ins are not accepted. [place] will see patients 25 years of age and older. One Wednesday Evening (Monthly: Volunteer Based).  $30 per visit, cash only  [place]  [phone] for adults; Children under age 55, call Graduate Pediatric Dentistry at [phone]. Children aged 41-14, please call [phone] to request a pediatric application.  Dental services are provided in all areas of dental care including fillings, crowns and bridges, complete and partial dentures, implants, gum treatment, root canals, and extractions. Preventive care is also provided. Treatment is provided to both adults and children. Patients are selected via a lottery and there is often a waiting list.   [facility] [address] [place]  [phone] [email or web address]   [facility] [address] [place], [place] [phone], Ext. [phone] Second and Fourth Thursday of each month, opens at 6:30 AM; Clinic ends at 9 AM.  Patients are seen on a first-come first-served basis, and a limited number are seen during each clinic.   [facility]  [address] Hillard Danker [place], [place] [phone]   Eligibility Requirements You must have lived in [place], [place], or [place] counties for at least the last three months.   You cannot be eligible for state or federal sponsored [employer], including [employer], [place], or [employer].   You generally cannot be eligible for healthcare insurance through your employer.    How to apply: Eligibility screenings are held every Tuesday and  Wednesday afternoon from 1:00 pm until 4:00 pm. You do not need an appointment for the interview!  [facility] [address], [address], [place]   [place]  [place]  [place]  [phone]    Behavioral  Health Resources in the Community: Intensive Outpatient Programs Organization         Address  Phone  Notes  [place]. [address], [place], [place] [phone]   [facility] Outpatient [address], [place], [place]   ADS: Alcohol & Drug Svcs [address], [place], [place]   [place] [address],  [place], [place] or [phone]   Substance Abuse Resources Organization         Address  Phone  Notes  Alcohol and Drug Services  [phone]   [place]  [phone]   [facility] [place]   Chinita Pester  [phone]   Residential & Outpatient Substance Abuse Program  [phone]   Psychological Services Organization         Address  Phone  Notes  [facility] [place]  [place]  [phone]   [place] [address] [place] or [phone]    Mobile Crisis Teams Organization         Address  Phone  Notes  [facility], Mobile Crisis Care Unit  [phone]   [facility]  [address] [place], [place]   [place] Levels [address] [place] [phone]    Self-Help/Support Groups Organization         Address  Phone             Notes  [place] - variety of support groups  [place] Call for more information  Narcotics Anonymous (NA), [facility] [address], [employer] [place]  2 meetings at this location   Financial planner         Address  Phone  Notes  [facility] [place],    [place]  [phone]   [facility]  [address], [place] [phone], [place], [place]   [place], [place] [phone] Admissions: 8am-3pm M-F  Incentives Substance [place] [address]    [place], [place] [phone]   [facility] [address] [place], [place], [place]   [facility] [address],  [place], [place]   [facility] - Intensive Outpatient [address] Kristeen [address], [place], [place]   [facility] ([place].) [place].,  [place], [place] [phone] or [phone]   [facility] (RTS) [address] [place], [place] Accepts Medicaid  [facility] [place] [address] [address]  [place] [phone] Substance Abuse/Addiction Treatment   [facility] Organization         Address  Phone  Notes  [facility]  [phone]   Domenic Schwab, PhD [address] Arlis Porta [place], [place]   [phone] or [phone]   [place], [place] [phone]   [place] [address] [place], [place] [phone] Insurance/Medicaid/sponsorship through [employer] and Families [address] [place] [address], [place]                                    [place], [place] [phone] [place] [address]   [place], [place] (  [phone]    Dr. Adele Schilder  [phone]   [facility] Dept. 1) [address], [place] 2) [place] 3)  [place] [address], [place] [phone]  [phone]   [place] [phone] or [phone] (After Hours)

## 2014-07-19 NOTE — ED Notes (Signed)
Pt sts some palpitations today and now SOB; pt sts pain with swallowing; pt sts unsure if could be anxiety

## 2014-07-19 NOTE — ED Notes (Signed)
Pt calling out because the machine keeps beeping and she feels like her "heart keeps racing". PA at the bedside.

## 2014-09-08 ENCOUNTER — Encounter (HOSPITAL_COMMUNITY): Payer: Self-pay

## 2014-09-08 ENCOUNTER — Inpatient Hospital Stay (HOSPITAL_COMMUNITY)
Admission: AD | Admit: 2014-09-08 | Discharge: 2014-09-08 | Disposition: A | Discharge status: Home / Self Care | Payer: BLUE CROSS/BLUE SHIELD | Source: Ambulatory Visit | Attending: Obstetrics and Gynecology | Admitting: Obstetrics and Gynecology

## 2014-09-08 ENCOUNTER — Inpatient Hospital Stay (HOSPITAL_COMMUNITY): Payer: BLUE CROSS/BLUE SHIELD

## 2014-09-08 DIAGNOSIS — Z3A01 Less than 8 weeks gestation of pregnancy: Secondary | ICD-10-CM | POA: Diagnosis not present

## 2014-09-08 DIAGNOSIS — O2341 Unspecified infection of urinary tract in pregnancy, first trimester: Secondary | ICD-10-CM | POA: Diagnosis not present

## 2014-09-08 DIAGNOSIS — R109 Unspecified abdominal pain: Secondary | ICD-10-CM | POA: Diagnosis present

## 2014-09-08 DIAGNOSIS — O26899 Other specified pregnancy related conditions, unspecified trimester: Secondary | ICD-10-CM

## 2014-09-08 LAB — URINALYSIS, ROUTINE W REFLEX MICROSCOPIC
Bilirubin Urine: NEGATIVE
GLUCOSE, UA: NEGATIVE mg/dL
KETONES UR: 15 mg/dL — AB
LEUKOCYTES UA: NEGATIVE
Nitrite: POSITIVE — AB
PH: 6 (ref 5.0–8.0)
Protein, ur: NEGATIVE mg/dL
Specific Gravity, Urine: >1.03 — ABNORMAL HIGH (ref 1.005–1.030)
Urobilinogen, UA: 0.2 mg/dL (ref 0.0–1.0)

## 2014-09-08 LAB — URINE MICROSCOPIC-ADD ON

## 2014-09-08 LAB — CBC
HEMATOCRIT: 36.3 % (ref 36.0–46.0)
HEMOGLOBIN: 12.2 g/dL (ref 12.0–15.0)
MCH: 30.8 pg (ref 26.0–34.0)
MCHC: 33.6 g/dL (ref 30.0–36.0)
MCV: 91.7 fL (ref 78.0–100.0)
Platelets: 217 10*3/uL (ref 150–400)
RBC: 3.96 MIL/uL (ref 3.87–5.11)
RDW: 12.5 % (ref 11.5–15.5)
WBC: 6.7 10*3/uL (ref 4.0–10.5)

## 2014-09-08 LAB — HCG, QUANTITATIVE, PREGNANCY: hCG, Beta Chain, Quant, S: 128214 m[IU]/mL — ABNORMAL HIGH (ref <5)

## 2014-09-08 LAB — POCT PREGNANCY, URINE: Preg Test, Ur: POSITIVE — AB

## 2014-09-08 MED ORDER — NITROFURANTOIN MONOHYD MACRO 100 MG PO CAPS: 100.0000 mg | ORAL_CAPSULE | Freq: Two times a day (BID) | ORAL | Status: DC

## 2014-09-08 NOTE — MAU Provider Note (Signed)
History     CSN: [id number]  Arrival date and time: 09/08/14 0137   First Provider Initiated Contact with Patient 09/08/14 0214      Chief Complaint  Patient presents with  . Abdominal Pain   HPI Comments: Bethany Odonnell is a 29 y.o. G3P1011 at [redacted]w[redacted]d who presents today with abdomainl pain. She states that the pain started about 3 hours ago, and she rates it an 8/10. She states that it is around the belly button and it is a sharp pain. She denies any vaginal bleeding. She has an appointment to start [facility] in [place] on 09/26/14. She states that her LMP was 07/20/14.   Abdominal Pain This is a new problem. The current episode started today. The onset quality is sudden. The problem occurs constantly. The pain is located in the periumbilical region. The pain is at a severity of 8/10. The quality of the pain is sharp. Associated symptoms include constipation (last BM was 3/23, but small and hard. ) and nausea. Pertinent negatives include no diarrhea, dysuria, fever, frequency or vomiting. She has tried nothing for the symptoms.   Past Medical History  Diagnosis Date  . GERD (gastroesophageal reflux disease)   . Scoliosis   . IBS (irritable bowel syndrome)   . Anxiety     Past Surgical History  Procedure Laterality Date  . Induced abortion      History reviewed. No pertinent family history.  History  Substance Use Topics  . Smoking status: Never Smoker   . Smokeless tobacco: Never Used  . Alcohol Use: Yes     Comment: occasionally    Allergies:  Allergies  Allergen Reactions  . Amoxicillin Nausea And Vomiting  . Penicillins Other (See Comments)    Dizzy, Stomach pain    Prescriptions prior to admission  Medication Sig Dispense Refill Last Dose  . bismuth subsalicylate (PEPTO BISMOL) 262 MG/15ML suspension Take 30 mLs by mouth every 6 (six) hours as needed for indigestion (acid/ stomach).   Past Month at Unknown  . calcium carbonate (TUMS - DOSED IN MG ELEMENTAL CALCIUM)  500 MG chewable tablet Chew 2 tablets by mouth every 4 (four) hours as needed for heartburn (for stomach).   Past Month at Unknown  . cephALEXin (KEFLEX) 500 MG capsule Take 1 capsule (500 mg total) by mouth 4 (four) times daily. 20 capsule 0   . hydrOXYzine (ATARAX/VISTARIL) 10 MG tablet Take 1 tablet (10 mg total) by mouth every 6 (six) hours as needed for anxiety. 30 tablet 0     Review of Systems  Constitutional: Negative for fever.  Gastrointestinal: Positive for nausea, abdominal pain and constipation (last BM was 3/23, but small and hard. ). Negative for vomiting and diarrhea.  Genitourinary: Negative for dysuria, urgency and frequency.       Sometimes pain with intercourse.    Physical Exam   Blood pressure 117/53, pulse 75, temperature 98.8 F (37.1 C), temperature source Oral, resp. rate 18, height 5\' 7"  (1.702 m), weight 79.039 kg (174 lb 4 oz), last menstrual period 07/20/2014, SpO2 99 %.  Physical Exam  Nursing note and vitals reviewed. Constitutional: She is oriented to person, place, and time. She appears well-developed and well-nourished. No distress.  Cardiovascular: Normal rate.   Respiratory: Effort normal.  GI: Soft. There is no tenderness. There is no rebound.  Neurological: She is alert and oriented to person, place, and time.  Skin: Skin is warm and dry.  Psychiatric: She has a normal mood and  affect.   Results for orders placed or performed during the hospital encounter of 09/08/14 (from the past 24 hour(s))  Urinalysis, Routine w reflex microscopic     Status: Abnormal   Collection Time: 09/08/14  2:01 AM  Result Value Ref Range   Color, Urine YELLOW YELLOW   APPearance CLOUDY (A) CLEAR   Specific Gravity, Urine >1.030 (H) 1.005 - 1.030   pH 6.0 5.0 - 8.0   Glucose, UA NEGATIVE NEGATIVE mg/dL   Hgb urine dipstick TRACE (A) NEGATIVE   Bilirubin Urine NEGATIVE NEGATIVE   Ketones, ur 15 (A) NEGATIVE mg/dL   Protein, ur NEGATIVE NEGATIVE mg/dL    Urobilinogen, UA 0.2 0.0 - 1.0 mg/dL   Nitrite POSITIVE (A) NEGATIVE   Leukocytes, UA NEGATIVE NEGATIVE  Urine microscopic-add on     Status: Abnormal   Collection Time: 09/08/14  2:01 AM  Result Value Ref Range   Squamous Epithelial / LPF FEW (A) RARE   WBC, UA 0-2 <3 WBC/hpf   RBC / HPF 0-2 <3 RBC/hpf   Bacteria, UA MANY (A) RARE  Pregnancy, urine POC     Status: Abnormal   Collection Time: 09/08/14  2:13 AM  Result Value Ref Range   Preg Test, Ur POSITIVE (A) NEGATIVE  CBC     Status: None   Collection Time: 09/08/14  2:28 AM  Result Value Ref Range   WBC 6.7 4.0 - 10.5 K/uL   RBC 3.96 3.87 - 5.11 MIL/uL   Hemoglobin 12.2 12.0 - 15.0 g/dL   HCT 36.3 36.0 - 46.0 %   MCV 91.7 78.0 - 100.0 fL   MCH 30.8 26.0 - 34.0 pg   MCHC 33.6 30.0 - 36.0 g/dL   RDW 12.5 11.5 - 15.5 %   Platelets 217 150 - 400 K/uL   US Ob Comp Less 14 Wks  09/08/2014   CLINICAL DATA:  Acute onset of mid abdominal pain for 3 hours. Initial encounter.  EXAM: OBSTETRIC <14 WK [place] AND TRANSVAGINAL OB US  TECHNIQUE: Both transabdominal and transvaginal ultrasound examinations were performed for complete evaluation of the gestation as well as the maternal uterus, adnexal regions, and pelvic cul-de-sac. Transvaginal technique was performed to assess early pregnancy.  COMPARISON:  Pelvic ultrasound performed 09/01/2009  FINDINGS: Intrauterine gestational sac: Visualized/normal in shape.  Yolk sac:  Yes  Embryo:  Yes  Cardiac Activity: Yes  Heart Rate: 132  bpm  CRL:  9.0  mm   7 w   0 d                  [place] EDC: 04/27/2015  Maternal uterus/adnexae: A small amount of subchorionic hemorrhage is noted. The uterus is otherwise unremarkable.  The right ovary is not visualized on this study. The left ovary is unremarkable in appearance, measuring 5.5 x 2.8 x 4.0 cm. No suspicious adnexal masses are seen; there is no evidence for ovarian torsion.  No free fluid is seen within the pelvic cul-de-sac.  IMPRESSION: 1. Single live  intrauterine pregnancy noted, with a crown-rump length of 9 mm, corresponding to a gestational age of [redacted] weeks 0 days. This matches the gestational age of [redacted] weeks 1 day by LMP, reflecting an estimated date of delivery of April 26, 2015. 2. Small amount of subchorionic hemorrhage noted.   Electronically Signed   By: Garald Balding M.D.   On: 09/08/2014 03:08   US Ob Transvaginal  09/08/2014   CLINICAL DATA:  Acute onset of mid abdominal  pain for 3 hours. Initial encounter.  EXAM: OBSTETRIC <14 WK [place] AND TRANSVAGINAL OB US  TECHNIQUE: Both transabdominal and transvaginal ultrasound examinations were performed for complete evaluation of the gestation as well as the maternal uterus, adnexal regions, and pelvic cul-de-sac. Transvaginal technique was performed to assess early pregnancy.  COMPARISON:  Pelvic ultrasound performed 09/01/2009  FINDINGS: Intrauterine gestational sac: Visualized/normal in shape.  Yolk sac:  Yes  Embryo:  Yes  Cardiac Activity: Yes  Heart Rate: 132  bpm  CRL:  9.0  mm   7 w   0 d                  [place] EDC: 04/27/2015  Maternal uterus/adnexae: A small amount of subchorionic hemorrhage is noted. The uterus is otherwise unremarkable.  The right ovary is not visualized on this study. The left ovary is unremarkable in appearance, measuring 5.5 x 2.8 x 4.0 cm. No suspicious adnexal masses are seen; there is no evidence for ovarian torsion.  No free fluid is seen within the pelvic cul-de-sac.  IMPRESSION: 1. Single live intrauterine pregnancy noted, with a crown-rump length of 9 mm, corresponding to a gestational age of [redacted] weeks 0 days. This matches the gestational age of [redacted] weeks 1 day by LMP, reflecting an estimated date of delivery of April 26, 2015. 2. Small amount of subchorionic hemorrhage noted.   Electronically Signed   By: Garald Balding M.D.   On: 09/08/2014 03:08   MAU Course  Procedures  MDM UA; urine culture  UPT CBC HCG OB US complete, less than 14 weeks.   Assessment and  Plan   1. UTI in pregnancy, antepartum, first trimester   2. Abdominal pain affecting pregnancy    DC home RX Macrobid First trimester precautions reviewed Return to MAU as needed   Mathis Bud 09/08/2014, 2:16 AM

## 2014-09-08 NOTE — MAU Note (Addendum)
Mid abd pain , hurting on the sides, started 3 hours ago.  Last intercourse was 2 days ago. No bleeding. No discharge. Has appt on April 11 th- first OB appt at [facility].

## 2014-09-08 NOTE — Discharge Instructions (Signed)
Safe Medications in Pregnancy  ° °Acne: °Benzoyl Peroxide °Salicylic Acid ° °Backache/Headache: °Tylenol: 2 regular strength every 4 hours OR °             2 Extra strength every 6 hours ° °Colds/Coughs/Allergies: °Benadryl (alcohol free) 25 mg every 6 hours as needed °Breath right strips °Claritin °Cepacol throat lozenges °Chloraseptic throat spray °Cold-Eeze- up to three times per day °Cough drops, alcohol free °Flonase (by prescription only) °Guaifenesin °Mucinex °Robitussin DM (plain only, alcohol free) °Saline nasal spray/drops °Sudafed (pseudoephedrine) & Actifed ** use only after [redacted] weeks gestation and if you do not have high blood pressure °Tylenol °Vicks Vaporub °Zinc lozenges °Zyrtec  ° °Constipation: °Colace °Ducolax suppositories °Fleet enema °Glycerin suppositories °Metamucil °Milk of magnesia °Miralax °Senokot °Smooth move tea ° °Diarrhea: °Kaopectate °Imodium A-D ° °*NO pepto Bismol ° °Hemorrhoids: °Anusol °Anusol HC °Preparation H °Tucks ° °Indigestion: °Tums °Maalox °Mylanta °Zantac  °Pepcid ° °Insomnia: °Benadryl (alcohol free) 25mg every 6 hours as needed °Tylenol PM °Unisom, no Gelcaps ° °Leg Cramps: °Tums °MagGel ° °Nausea/Vomiting:  °Bonine °Dramamine °Emetrol °Ginger extract °Sea bands °Meclizine  °Nausea medication to take during pregnancy:  °Unisom (doxylamine succinate 25 mg tablets) Take one tablet daily at bedtime. If symptoms are not adequately controlled, the dose can be increased to a maximum recommended dose of two tablets daily (1/2 tablet in the morning, 1/2 tablet mid-afternoon and one at bedtime). °Vitamin B6 100mg tablets. Take one tablet twice a day (up to 200 mg per day). ° °Skin Rashes: °Aveeno products °Benadryl cream or 25mg every 6 hours as needed °Calamine Lotion °1% cortisone cream ° °Yeast infection: °Gyne-lotrimin 7 °Monistat 7 ° ° °**If taking multiple medications, please check labels to avoid duplicating the same active ingredients °**take medication as directed on  the label °** Do not exceed 4000 mg of tylenol in 24 hours °**Do not take medications that contain aspirin or ibuprofen ° ° ° °First Trimester of Pregnancy °The first trimester of pregnancy is from week 1 until the end of week 12 (months 1 through 3). A week after a sperm fertilizes an egg, the egg will implant on the wall of the uterus. This embryo will begin to develop into a baby. Genes from you and your partner are forming the baby. The female genes determine whether the baby is a boy or a girl. At 6-8 weeks, the eyes and face are formed, and the heartbeat can be seen on ultrasound. At the end of 12 weeks, all the baby's organs are formed.  °Now that you are pregnant, you will want to do everything you can to have a healthy baby. Two of the most important things are to get good prenatal care and to follow your health care provider's instructions. Prenatal care is all the medical care you receive before the baby's birth. This care will help prevent, find, and treat any problems during the pregnancy and childbirth. °BODY CHANGES °Your body goes through many changes during pregnancy. The changes vary from woman to woman.  °· You may gain or lose a couple of pounds at first. °· You may feel sick to your stomach (nauseous) and throw up (vomit). If the vomiting is uncontrollable, call your health care provider. °· You may tire easily. °· You may develop headaches that can be relieved by medicines approved by your health care provider. °· You may urinate more often. Painful urination may mean you have a bladder infection. °· You may develop heartburn as a result of your   pregnancy. °· You may develop constipation because certain hormones are causing the muscles that push waste through your intestines to slow down. °· You may develop hemorrhoids or swollen, bulging veins (varicose veins). °· Your breasts may begin to grow larger and become tender. Your nipples may stick out more, and the tissue that surrounds them (areola)  may become darker. °· Your gums may bleed and may be sensitive to brushing and flossing. °· Dark spots or blotches (chloasma, mask of pregnancy) may develop on your face. This will likely fade after the baby is born. °· Your menstrual periods will stop. °· You may have a loss of appetite. °· You may develop cravings for certain kinds of food. °· You may have changes in your emotions from day to day, such as being excited to be pregnant or being concerned that something may go wrong with the pregnancy and baby. °· You may have more vivid and strange dreams. °· You may have changes in your hair. These can include thickening of your hair, rapid growth, and changes in texture. Some women also have hair loss during or after pregnancy, or hair that feels dry or thin. Your hair will most likely return to normal after your baby is born. °WHAT TO EXPECT AT YOUR PRENATAL VISITS °During a routine prenatal visit: °· You will be weighed to make sure you and the baby are growing normally. °· Your blood pressure will be taken. °· Your abdomen will be measured to track your baby's growth. °· The fetal heartbeat will be listened to starting around week 10 or 12 of your pregnancy. °· Test results from any previous visits will be discussed. °Your health care provider may ask you: °· How you are feeling. °· If you are feeling the baby move. °· If you have had any abnormal symptoms, such as leaking fluid, bleeding, severe headaches, or abdominal cramping. °· If you have any questions. °Other tests that may be performed during your first trimester include: °· Blood tests to find your blood type and to check for the presence of any previous infections. They will also be used to check for low iron levels (anemia) and Rh antibodies. Later in the pregnancy, blood tests for diabetes will be done along with other tests if problems develop. °· Urine tests to check for infections, diabetes, or protein in the urine. °· An ultrasound to confirm  the proper growth and development of the baby. °· An amniocentesis to check for possible genetic problems. °· Fetal screens for spina bifida and Down syndrome. °· You may need other tests to make sure you and the baby are doing well. °HOME CARE INSTRUCTIONS  °Medicines °· Follow your health care provider's instructions regarding medicine use. Specific medicines may be either safe or unsafe to take during pregnancy. °· Take your prenatal vitamins as directed. °· If you develop constipation, try taking a stool softener if your health care provider approves. °Diet °· Eat regular, well-balanced meals. Choose a variety of foods, such as meat or vegetable-based protein, fish, milk and low-fat dairy products, vegetables, fruits, and whole grain breads and cereals. Your health care provider will help you determine the amount of weight gain that is right for you. °· Avoid raw meat and uncooked cheese. These carry germs that can cause birth defects in the baby. °· Eating four or five small meals rather than three large meals a day may help relieve nausea and vomiting. If you start to feel nauseous, eating a few soda crackers   can be helpful. Drinking liquids between meals instead of during meals also seems to help nausea and vomiting. °· If you develop constipation, eat more high-fiber foods, such as fresh vegetables or fruit and whole grains. Drink enough fluids to keep your urine clear or pale yellow. °Activity and Exercise °· Exercise only as directed by your health care provider. Exercising will help you: °¨ Control your weight. °¨ Stay in shape. °¨ Be prepared for labor and delivery. °· Experiencing pain or cramping in the lower abdomen or low back is a good sign that you should stop exercising. Check with your health care provider before continuing normal exercises. °· Try to avoid standing for long periods of time. Move your legs often if you must stand in one place for a long time. °· Avoid heavy lifting. °· Wear  low-heeled shoes, and practice good posture. °· You may continue to have sex unless your health care provider directs you otherwise. °Relief of Pain or Discomfort °· Wear a good support bra for breast tenderness.   °· Take warm sitz baths to soothe any pain or discomfort caused by hemorrhoids. Use hemorrhoid cream if your health care provider approves.   °· Rest with your legs elevated if you have leg cramps or low back pain. °· If you develop varicose veins in your legs, wear support hose. Elevate your feet for 15 minutes, 3-4 times a day. Limit salt in your diet. °Prenatal Care °· Schedule your prenatal visits by the twelfth week of pregnancy. They are usually scheduled monthly at first, then more often in the last 2 months before delivery. °· Write down your questions. Take them to your prenatal visits. °· Keep all your prenatal visits as directed by your health care provider. °Safety °· Wear your seat belt at all times when driving. °· Make a list of emergency phone numbers, including numbers for family, friends, the hospital, and police and fire departments. °General Tips °· Ask your health care provider for a referral to a local prenatal education class. Begin classes no later than at the beginning of month 6 of your pregnancy. °· Ask for help if you have counseling or nutritional needs during pregnancy. Your health care provider can offer advice or refer you to specialists for help with various needs. °· Do not use hot tubs, steam rooms, or saunas. °· Do not douche or use tampons or scented sanitary pads. °· Do not cross your legs for long periods of time. °· Avoid cat litter boxes and soil used by cats. These carry germs that can cause birth defects in the baby and possibly loss of the fetus by miscarriage or stillbirth. °· Avoid all smoking, herbs, alcohol, and medicines not prescribed by your health care provider. Chemicals in these affect the formation and growth of the baby. °· Schedule a dentist  appointment. At home, brush your teeth with a soft toothbrush and be gentle when you floss. °SEEK MEDICAL CARE IF:  °· You have dizziness. °· You have mild pelvic cramps, pelvic pressure, or nagging pain in the abdominal area. °· You have persistent nausea, vomiting, or diarrhea. °· You have a bad smelling vaginal discharge. °· You have pain with urination. °· You notice increased swelling in your face, hands, legs, or ankles. °SEEK IMMEDIATE MEDICAL CARE IF:  °· You have a fever. °· You are leaking fluid from your vagina. °· You have spotting or bleeding from your vagina. °· You have severe abdominal cramping or pain. °· You have rapid weight gain   or loss. °· You vomit blood or material that looks like coffee grounds. °· You are exposed to German measles and have never had them. °· You are exposed to fifth disease or chickenpox. °· You develop a severe headache. °· You have shortness of breath. °· You have any kind of trauma, such as from a fall or a car accident. °Document Released: 05/28/2001 Document Revised: 10/18/2013 Document Reviewed: 04/13/2013 °Exit[employer]® Patient Information ©2015 [employer]. This information is not intended to replace advice given to you by your health care provider. Make sure you discuss any questions you have with your health care provider. ° °

## 2014-09-11 LAB — URINE CULTURE: Colony Count: >=100000

## 2014-09-15 ENCOUNTER — Ambulatory Visit (INDEPENDENT_AMBULATORY_CARE_PROVIDER_SITE_OTHER): Payer: BLUE CROSS/BLUE SHIELD | Admitting: Obstetrics & Gynecology

## 2014-09-15 ENCOUNTER — Encounter: Payer: Self-pay | Admitting: Obstetrics & Gynecology

## 2014-09-15 VITALS — BP 109/54 | HR 69 | Temp 98.5°F | Resp 16 | Ht 67.0 in | Wt 170.0 lb

## 2014-09-15 DIAGNOSIS — N390 Urinary tract infection, site not specified: Secondary | ICD-10-CM | POA: Diagnosis not present

## 2014-09-15 LAB — POCT URINALYSIS DIPSTICK
BILIRUBIN UA: NEGATIVE
Blood, UA: NEGATIVE
GLUCOSE UA: NEGATIVE
KETONES UA: NEGATIVE
Leukocytes, UA: NEGATIVE
NITRITE UA: POSITIVE
PH UA: 7
Spec Grav, UA: 1.015
Urobilinogen, UA: NEGATIVE

## 2014-09-15 MED ORDER — CEFTRIAXONE SODIUM 1 G IJ SOLR
250.0000 mg | Freq: Once | INTRAMUSCULAR | Status: AC
  Administered 2014-09-15: 250 mg via INTRAMUSCULAR

## 2014-09-15 NOTE — Progress Notes (Signed)
   Subjective:    Patient ID: Bethany Odonnell, female    DOB: [birth year], 29 y.o.   MRN: [id number]  HPI  This 29 yo AA P88 (22 yo daughter Lorayne Bender) is here because she still has symptoms of a UTI. She had a culture that grew Klebsiella. She had been given macrobid but did not take many of them as she finds it difficult to take pills.  Review of Systems She is taking chewable vitamins. Her NOB visit is 09-26-14.    Objective:   Physical Exam [id number] Breathing and ambulating normally No CVAT       Assessment & Plan:  Klebsiella UTI- treat with IM rocephin KA for NOB

## 2014-09-18 LAB — CULTURE, URINE COMPREHENSIVE: Colony Count: >=100000

## 2014-09-26 ENCOUNTER — Encounter: Payer: Self-pay | Admitting: Obstetrics & Gynecology

## 2014-09-26 ENCOUNTER — Ambulatory Visit (INDEPENDENT_AMBULATORY_CARE_PROVIDER_SITE_OTHER): Payer: BLUE CROSS/BLUE SHIELD | Admitting: Obstetrics & Gynecology

## 2014-09-26 VITALS — BP 109/61 | HR 90 | Wt 170.0 lb

## 2014-09-26 DIAGNOSIS — Z113 Encounter for screening for infections with a predominantly sexual mode of transmission: Secondary | ICD-10-CM | POA: Diagnosis not present

## 2014-09-26 DIAGNOSIS — Z1151 Encounter for screening for human papillomavirus (HPV): Secondary | ICD-10-CM | POA: Diagnosis not present

## 2014-09-26 DIAGNOSIS — Z124 Encounter for screening for malignant neoplasm of cervix: Secondary | ICD-10-CM | POA: Diagnosis not present

## 2014-09-26 DIAGNOSIS — Z3480 Encounter for supervision of other normal pregnancy, unspecified trimester: Secondary | ICD-10-CM | POA: Insufficient documentation

## 2014-09-26 DIAGNOSIS — Z3481 Encounter for supervision of other normal pregnancy, first trimester: Secondary | ICD-10-CM

## 2014-09-26 DIAGNOSIS — Z3491 Encounter for supervision of normal pregnancy, unspecified, first trimester: Secondary | ICD-10-CM

## 2014-09-26 LAB — US OB LIMITED

## 2014-09-26 NOTE — Progress Notes (Signed)
Bedside US shows SIUP with FHR 169 and CRL 2.47cm [redacted]w[redacted]d GA

## 2014-09-26 NOTE — Progress Notes (Signed)
   Subjective:    Bethany Odonnell is a [id number] [redacted]w[redacted]d being seen today for her first obstetrical visit.  Her obstetrical history is significant for none. Patient does intend to breast feed. Pregnancy history fully reviewed.  Patient reports fatigue.  Filed Vitals:   09/26/14 1542  BP: 109/61  Pulse: 90  Weight: 170 lb (77.111 kg)    HISTORY: OB History  Gravida Para Term Preterm AB SAB TAB Ectopic Multiple Living  3 1 1  1     1     # Outcome Date GA Lbr Len/2nd Weight Sex Delivery Anes PTL Lv  3 Current           2 AB           1 Term     F Vag-Spont   Y     Past Medical History  Diagnosis Date  . GERD (gastroesophageal reflux disease)   . Scoliosis   . IBS (irritable bowel syndrome)   . Anxiety    Past Surgical History  Procedure Laterality Date  . Induced abortion     No family history on file.   Exam    Uterus:     Pelvic Exam:    Perineum: No Hemorrhoids   Vulva: normal   Vagina:  normal mucosa   pH:    Cervix: anteverted   Adnexa: normal adnexa   Bony Pelvis: anthropoid  System: Breast:  normal appearance, no masses or tenderness   Skin: normal coloration and turgor, no rashes    Neurologic: oriented   Extremities: normal strength, tone, and muscle mass   HEENT PERRLA   Mouth/Teeth mucous membranes moist, pharynx normal without lesions   Neck supple   Cardiovascular: regular rate and rhythm   Respiratory:  appears well, vitals normal, no respiratory distress, acyanotic, normal RR, ear and throat exam is normal, neck free of mass or lymphadenopathy, chest clear, no wheezing, crepitations, rhonchi, normal symmetric air entry   Abdomen: soft, non-tender; bowel sounds normal; no masses,  no organomegaly   Urinary: urethral meatus normal      Assessment:    Pregnancy: G3P1011 There are no active problems to display for this patient.       Plan:     Initial labs drawn. Prenatal vitamins. Problem list reviewed and updated. Genetic  Screening discussed Quad Screen: requested.  Ultrasound discussed; fetal survey: requested.  Follow up in 4 weeks.    Priyana Odonnell C. 09/26/2014

## 2014-09-27 LAB — OBSTETRIC PANEL
ANTIBODY SCREEN: NEGATIVE
Basophils Absolute: 0 10*3/uL (ref 0.0–0.1)
Basophils Relative: 0 % (ref 0–1)
EOS ABS: 0.2 10*3/uL (ref 0.0–0.7)
Eosinophils Relative: 2 % (ref 0–5)
HCT: 36.7 % (ref 36.0–46.0)
HEMOGLOBIN: 12.3 g/dL (ref 12.0–15.0)
HEP B S AG: NEGATIVE
Lymphocytes Relative: 25 % (ref 12–46)
Lymphs Abs: 1.9 10*3/uL (ref 0.7–4.0)
MCH: 30.6 pg (ref 26.0–34.0)
MCHC: 33.5 g/dL (ref 30.0–36.0)
MCV: 91.3 fL (ref 78.0–100.0)
MPV: 10.7 fL (ref 8.6–12.4)
Monocytes Absolute: 0.7 10*3/uL (ref 0.1–1.0)
Monocytes Relative: 9 % (ref 3–12)
Neutro Abs: 4.9 10*3/uL (ref 1.7–7.7)
Neutrophils Relative %: 64 % (ref 43–77)
Platelets: 218 10*3/uL (ref 150–400)
RBC: 4.02 MIL/uL (ref 3.87–5.11)
RDW: 13.9 % (ref 11.5–15.5)
RUBELLA: 2.69 {index} — AB (ref <0.90)
Rh Type: POSITIVE
WBC: 7.6 10*3/uL (ref 4.0–10.5)

## 2014-09-27 LAB — HIV ANTIBODY (ROUTINE TESTING W REFLEX): HIV 1&2 Ab, 4th Generation: NONREACTIVE

## 2014-09-27 LAB — SICKLE CELL SCREEN: Sickle Cell Screen: NEGATIVE

## 2014-09-28 LAB — CYTOLOGY - PAP

## 2014-09-29 LAB — CERVICOVAGINAL ANCILLARY ONLY: Candida vaginitis: NEGATIVE

## 2014-10-04 ENCOUNTER — Telehealth: Payer: Self-pay | Admitting: *Deleted

## 2014-10-04 DIAGNOSIS — B9689 Other specified bacterial agents as the cause of diseases classified elsewhere: Secondary | ICD-10-CM

## 2014-10-04 DIAGNOSIS — N760 Acute vaginitis: Principal | ICD-10-CM

## 2014-10-04 MED ORDER — METRONIDAZOLE 500 MG PO TABS: 500.0000 mg | ORAL_TABLET | Freq: Two times a day (BID) | ORAL | Status: DC

## 2014-10-04 NOTE — Telephone Encounter (Signed)
LM on voicemail to call office.  Pt does have Gardnerella and RX for Flagyl was sent to her pharmacy.

## 2014-10-07 ENCOUNTER — Encounter (HOSPITAL_COMMUNITY): Payer: Self-pay | Admitting: *Deleted

## 2014-10-07 ENCOUNTER — Inpatient Hospital Stay (HOSPITAL_COMMUNITY)
Admission: AD | Admit: 2014-10-07 | Discharge: 2014-10-08 | Disposition: A | Discharge status: Home / Self Care | Payer: BLUE CROSS/BLUE SHIELD | Source: Ambulatory Visit | Attending: Family Medicine | Admitting: Family Medicine

## 2014-10-07 DIAGNOSIS — O219 Vomiting of pregnancy, unspecified: Secondary | ICD-10-CM | POA: Diagnosis not present

## 2014-10-07 DIAGNOSIS — O210 Mild hyperemesis gravidarum: Secondary | ICD-10-CM | POA: Diagnosis present

## 2014-10-07 DIAGNOSIS — Z3A11 11 weeks gestation of pregnancy: Secondary | ICD-10-CM | POA: Insufficient documentation

## 2014-10-07 DIAGNOSIS — E860 Dehydration: Secondary | ICD-10-CM

## 2014-10-07 DIAGNOSIS — O211 Hyperemesis gravidarum with metabolic disturbance: Secondary | ICD-10-CM | POA: Diagnosis not present

## 2014-10-07 LAB — URINALYSIS, ROUTINE W REFLEX MICROSCOPIC
Bilirubin Urine: NEGATIVE
GLUCOSE, UA: NEGATIVE mg/dL
Hgb urine dipstick: NEGATIVE
Ketones, ur: 15 mg/dL — AB
LEUKOCYTES UA: NEGATIVE
Nitrite: NEGATIVE
Protein, ur: NEGATIVE mg/dL
Specific Gravity, Urine: 1.015 (ref 1.005–1.030)
Urobilinogen, UA: 0.2 mg/dL (ref 0.0–1.0)
pH: 5.5 (ref 5.0–8.0)

## 2014-10-07 NOTE — MAU Note (Addendum)
Dizzy and lightheaded for 2-3 days. More vomiting this wk usual and more BMs. Loose but not diarrhea. States is currently using Metranidizole  For BV

## 2014-10-08 DIAGNOSIS — O219 Vomiting of pregnancy, unspecified: Secondary | ICD-10-CM

## 2014-10-08 LAB — COMPREHENSIVE METABOLIC PANEL
ALBUMIN: 3.4 g/dL — AB (ref 3.5–5.2)
ALT: 8 U/L (ref 0–35)
AST: 12 U/L (ref 0–37)
Alkaline Phosphatase: 51 U/L (ref 39–117)
Anion gap: 7 (ref 5–15)
BUN: 6 mg/dL (ref 6–23)
CALCIUM: 8.3 mg/dL — AB (ref 8.4–10.5)
CO2: 22 mmol/L (ref 19–32)
Chloride: 104 mmol/L (ref 96–112)
Creatinine, Ser: 0.47 mg/dL — ABNORMAL LOW (ref 0.50–1.10)
GFR calc Af Amer: >90 mL/min (ref >90)
GFR calc non Af Amer: >90 mL/min (ref >90)
GLUCOSE: 170 mg/dL — AB (ref 70–99)
Potassium: 3.2 mmol/L — ABNORMAL LOW (ref 3.5–5.1)
SODIUM: 133 mmol/L — AB (ref 135–145)
TOTAL PROTEIN: 6.1 g/dL (ref 6.0–8.3)
Total Bilirubin: 0.7 mg/dL (ref 0.3–1.2)

## 2014-10-08 LAB — CBC
HEMATOCRIT: 32.6 % — AB (ref 36.0–46.0)
HEMOGLOBIN: 11.5 g/dL — AB (ref 12.0–15.0)
MCH: 31.6 pg (ref 26.0–34.0)
MCHC: 35.3 g/dL (ref 30.0–36.0)
MCV: 89.6 fL (ref 78.0–100.0)
Platelets: 184 10*3/uL (ref 150–400)
RBC: 3.64 MIL/uL — AB (ref 3.87–5.11)
RDW: 12.6 % (ref 11.5–15.5)
WBC: 8 10*3/uL (ref 4.0–10.5)

## 2014-10-08 MED ORDER — PROMETHAZINE HCL 25 MG PO TABS: 12.5000 mg | ORAL_TABLET | Freq: Four times a day (QID) | ORAL | Status: DC | PRN

## 2014-10-08 MED ORDER — DEXTROSE 5 % IN LACTATED RINGERS IV BOLUS
1000.0000 mL | Freq: Once | INTRAVENOUS | Status: AC
  Administered 2014-10-08: 1000 mL via INTRAVENOUS

## 2014-10-08 MED ORDER — PROMETHAZINE HCL 25 MG/ML IJ SOLN
25.0000 mg | Freq: Once | INTRAMUSCULAR | Status: AC
  Administered 2014-10-08: 25 mg via INTRAVENOUS
  Filled 2014-10-08: qty 1

## 2014-10-08 NOTE — MAU Provider Note (Signed)
Chief Complaint: Dizziness and Fatigue   First Provider Initiated Contact with Patient 10/08/14 0001      SUBJECTIVE HPI: Bethany Odonnell is a 29 y.o. G3P1011 at [redacted]w[redacted]d by LMP who presents to maternity admissions reporting nausea and vomiting 2 times daily and frequent stools x 2 weeks.  She denies sick contacts.  She also reports onset of dizziness today.  She denies abdominal pain, vaginal bleeding, vaginal itching/burning, urinary symptoms, h/a, dizziness, n/v, or fever/chills.    Dizziness This is a new problem. The current episode started today. The problem occurs constantly. The problem has been unchanged. Associated symptoms include abdominal pain, a change in bowel habit, nausea and vomiting. Pertinent negatives include no chest pain, chills, fever or headaches. Nothing aggravates the symptoms. She has tried nothing for the symptoms.  Emesis  This is a new problem. The current episode started 1 to 4 weeks ago. The problem occurs 2 to 4 times per day. The problem has been waxing and waning. The emesis has an appearance of stomach contents. There has been no fever. Associated symptoms include abdominal pain and dizziness. Pertinent negatives include no chest pain, chills, diarrhea, fever or headaches. She has tried nothing for the symptoms.     Past Medical History  Diagnosis Date  . GERD (gastroesophageal reflux disease)   . Scoliosis   . IBS (irritable bowel syndrome)   . Anxiety    Past Surgical History  Procedure Laterality Date  . Induced abortion     History   Social History  . Marital Status: Single    Spouse Name: N/A  . Number of Children: N/A  . Years of Education: N/A   Occupational History  . Not on file.   Social History Main Topics  . Smoking status: Never Smoker   . Smokeless tobacco: Never Used  . Alcohol Use: Yes     Comment: occasionally  . Drug Use: No  . Sexual Activity: Not on file   Other Topics Concern  . Not on file   Social History  Narrative   No current facility-administered medications on file prior to encounter.   Current Outpatient Prescriptions on File Prior to Encounter  Medication Sig Dispense Refill  . metroNIDAZOLE (FLAGYL) 500 MG tablet Take 1 tablet (500 mg total) by mouth 2 (two) times daily. 14 tablet 0  . PRENATAL VIT W/FE-METHYLFOL-FA PO Take by mouth.    . calcium carbonate (TUMS - DOSED IN MG ELEMENTAL CALCIUM) 500 MG chewable tablet Chew 2 tablets by mouth every 4 (four) hours as needed for heartburn (for stomach).    . nitrofurantoin, macrocrystal-monohydrate, (MACROBID) 100 MG capsule Take 1 capsule (100 mg total) by mouth 2 (two) times daily. 14 capsule 0   Allergies  Allergen Reactions  . Amoxicillin Nausea And Vomiting  . Penicillins Other (See Comments)    Dizzy, Stomach pain    Review of Systems  Constitutional: Negative for fever and chills.  Respiratory: Negative for shortness of breath.   Cardiovascular: Negative for chest pain.  Gastrointestinal: Positive for nausea, vomiting, abdominal pain and change in bowel habit. Negative for diarrhea.       Loose stools x 2-3/day  Musculoskeletal: Negative for back pain.  Neurological: Positive for dizziness. Negative for headaches.    OBJECTIVE Blood pressure 111/68, pulse 74, temperature 98.2 F (36.8 C), resp. rate 18, height 5\' 7"  (1.702 m), weight 75.841 kg (167 lb 3.2 oz), last menstrual period 07/20/2014, SpO2 100 %. GENERAL: Well-developed, well-nourished female in no  acute distress.  HEENT: Normocephalic HEART: normal rate, heart sounds, regular rhythm RESP: normal effort, lung sounds clear and equal bilaterally ABDOMEN: Soft, non-tender EXTREMITIES: Nontender, no edema NEURO: Alert and oriented  FHT 160 by doppler  LAB RESULTS Results for orders placed or performed during the hospital encounter of 10/07/14 (from the past 24 hour(s))  Urinalysis, Routine w reflex microscopic     Status: Abnormal   Collection Time: 10/07/14   9:21 PM  Result Value Ref Range   Color, Urine YELLOW YELLOW   APPearance CLEAR CLEAR   Specific Gravity, Urine 1.015 1.005 - 1.030   pH 5.5 5.0 - 8.0   Glucose, UA NEGATIVE NEGATIVE mg/dL   Hgb urine dipstick NEGATIVE NEGATIVE   Bilirubin Urine NEGATIVE NEGATIVE   Ketones, ur 15 (A) NEGATIVE mg/dL   Protein, ur NEGATIVE NEGATIVE mg/dL   Urobilinogen, UA 0.2 0.0 - 1.0 mg/dL   Nitrite NEGATIVE NEGATIVE   Leukocytes, UA NEGATIVE NEGATIVE  CBC     Status: Abnormal   Collection Time: 10/08/14 12:40 AM  Result Value Ref Range   WBC 8.0 4.0 - 10.5 K/uL   RBC 3.64 (L) 3.87 - 5.11 MIL/uL   Hemoglobin 11.5 (L) 12.0 - 15.0 g/dL   HCT 32.6 (L) 36.0 - 46.0 %   MCV 89.6 78.0 - 100.0 fL   MCH 31.6 26.0 - 34.0 pg   MCHC 35.3 30.0 - 36.0 g/dL   RDW 12.6 11.5 - 15.5 %   Platelets 184 150 - 400 K/uL  Comprehensive metabolic panel     Status: Abnormal   Collection Time: 10/08/14 12:40 AM  Result Value Ref Range   Sodium 133 (L) 135 - 145 mmol/L   Potassium 3.2 (L) 3.5 - 5.1 mmol/L   Chloride 104 96 - 112 mmol/L   CO2 22 19 - 32 mmol/L   Glucose, Bld 170 (H) 70 - 99 mg/dL   BUN 6 6 - 23 mg/dL   Creatinine, Ser 0.47 (L) 0.50 - 1.10 mg/dL   Calcium 8.3 (L) 8.4 - 10.5 mg/dL   Total Protein 6.1 6.0 - 8.3 g/dL   Albumin 3.4 (L) 3.5 - 5.2 g/dL   AST 12 0 - 37 U/L   ALT 8 0 - 35 U/L   Alkaline Phosphatase 51 39 - 117 U/L   Total Bilirubin 0.7 0.3 - 1.2 mg/dL   GFR calc non Af Amer >90 >90 mL/min   GFR calc Af Amer >90 >90 mL/min   Anion gap 7 5 - 15    ASSESSMENT 1. Nausea and vomiting during pregnancy prior to [redacted] weeks gestation   2. Mild dehydration     PLAN Discharge home Phenergan 12.5-25 mg PO Q 6 hours PRN Increase PO fluids and food as tolerated   Follow-up Information    Follow up with [facility] [place].   Specialty:  Obstetrics and Gynecology   Why:  As scheduled   Contact information:   [place], [place] [phone]      Follow up with [facility].   Why:  As needed for emergencies   Contact information:   [id number] [place] Certified Nurse-Midwife 10/08/2014  2:21 AM

## 2014-10-08 NOTE — Progress Notes (Signed)
Written and verbal d/c instructions given and understanding voiced. 

## 2014-10-08 NOTE — Discharge Instructions (Signed)
Eating Plan for Hyperemesis Gravidarum/Nausea and Vomiting of Pregnancy Severe cases of hyperemesis gravidarum can lead to dehydration and malnutrition. The hyperemesis eating plan is one way to lessen the symptoms of nausea and vomiting. It is often used with prescribed medicines to control your symptoms.  WHAT CAN I DO TO RELIEVE MY SYMPTOMS? Listen to your body. Everyone is different and has different preferences. Find what works best for you. Some of the following things may help:  Eat and drink slowly.  Eat 5-6 small meals daily instead of 3 large meals.   Eat crackers before you get out of bed in the morning.   Starchy foods are usually well tolerated (such as cereal, toast, bread, potatoes, pasta, rice, and pretzels).   Ginger may help with nausea. Add  tsp ground ginger to hot tea or choose ginger tea.   Try drinking 100% fruit juice or an electrolyte drink.  Continue to take your prenatal vitamins as directed by your health care provider. If you are having trouble taking your prenatal vitamins, talk with your health care provider about different options.  Include at least 1 serving of protein with your meals and snacks (such as meats or poultry, beans, nuts, eggs, or yogurt). Try eating a protein-rich snack before bed (such as cheese and crackers or a half [place] or peanut butter sandwich). WHAT THINGS SHOULD I AVOID TO REDUCE MY SYMPTOMS? The following things may help reduce your symptoms:  Avoid foods with strong smells. Try eating meals in well-ventilated areas that are free of odors.  Avoid drinking water or other beverages with meals. Try not to drink anything less than 30 minutes before and after meals.  Avoid drinking more than 1 cup of fluid at a time.  Avoid fried or high-fat foods, such as butter and cream sauces.  Avoid spicy foods.  Avoid skipping meals the best you can. Nausea can be more intense on an empty stomach. If you cannot tolerate food at that  time, do not force it. Try sucking on ice chips or other frozen items and make up the calories later.  Avoid lying down within 2 hours after eating. Document Released: 03/31/2007 Document Revised: 06/08/2013 Document Reviewed: 04/07/2013 [facility] Patient Information 2015 [place], [place]. This information is not intended to replace advice given to you by your health care provider. Make sure you discuss any questions you have with your health care provider.   Dehydration, Adult Dehydration is when you lose more fluids from the body than you take in. Vital organs like the kidneys, brain, and heart cannot function without a proper amount of fluids and salt. Any loss of fluids from the body can cause dehydration.  CAUSES   Vomiting.  Diarrhea.  Excessive sweating.  Excessive urine output.  Fever. SYMPTOMS  Mild dehydration  Thirst.  Dry lips.  Slightly dry mouth. Moderate dehydration  Very dry mouth.  Sunken eyes.  Skin does not bounce back quickly when lightly pinched and released.  Dark urine and decreased urine production.  Decreased tear production.  Headache. Severe dehydration  Very dry mouth.  Extreme thirst.  Rapid, weak pulse (more than 100 beats per minute at rest).  Cold hands and feet.  Not able to sweat in spite of heat and temperature.  Rapid breathing.  Blue lips.  Confusion and lethargy.  Difficulty being awakened.  Minimal urine production.  No tears. DIAGNOSIS  Your caregiver will diagnose dehydration based on your symptoms and your exam. Blood and urine tests will help  confirm the diagnosis. The diagnostic evaluation should also identify the cause of dehydration. TREATMENT  Treatment of mild or moderate dehydration can often be done at home by increasing the amount of fluids that you drink. It is best to drink small amounts of fluid more often. Drinking too much at one time can make vomiting worse. Refer to the home care instructions  below. Severe dehydration needs to be treated at the hospital where you will probably be given intravenous (IV) fluids that contain water and electrolytes. HOME CARE INSTRUCTIONS   Ask your caregiver about specific rehydration instructions.  Drink enough fluids to keep your urine clear or pale yellow.  Drink small amounts frequently if you have nausea and vomiting.  Eat as you normally do.  Avoid:  Foods or drinks high in sugar.  Carbonated drinks.  Juice.  Extremely hot or cold fluids.  Drinks with caffeine.  Fatty, greasy foods.  Alcohol.  Tobacco.  Overeating.  Gelatin desserts.  Wash your hands well to avoid spreading bacteria and viruses.  Only take over-the-counter or prescription medicines for pain, discomfort, or fever as directed by your caregiver.  Ask your caregiver if you should continue all prescribed and over-the-counter medicines.  Keep all follow-up appointments with your caregiver. SEEK MEDICAL CARE IF:  You have abdominal pain and it increases or stays in one area (localizes).  You have a rash, stiff neck, or severe headache.  You are irritable, sleepy, or difficult to awaken.  You are weak, dizzy, or extremely thirsty. SEEK IMMEDIATE MEDICAL CARE IF:   You are unable to keep fluids down or you get worse despite treatment.  You have frequent episodes of vomiting or diarrhea.  You have blood or green matter (bile) in your vomit.  You have blood in your stool or your stool looks black and tarry.  You have not urinated in 6 to 8 hours, or you have only urinated a small amount of very dark urine.  You have a fever.  You faint. MAKE SURE YOU:   Understand these instructions.  Will watch your condition.  Will get help right away if you are not doing well or get worse. Document Released: 06/03/2005 Document Revised: 08/26/2011 Document Reviewed: 01/21/2011 [facility] Patient Information 2015 [place], [place]. This information is not  intended to replace advice given to you by your health care provider. Make sure you discuss any questions you have with your health care provider.

## 2014-10-19 ENCOUNTER — Encounter: Payer: Self-pay | Admitting: Obstetrics & Gynecology

## 2014-10-24 ENCOUNTER — Ambulatory Visit (INDEPENDENT_AMBULATORY_CARE_PROVIDER_SITE_OTHER): Payer: BLUE CROSS/BLUE SHIELD | Admitting: Obstetrics & Gynecology

## 2014-10-24 ENCOUNTER — Encounter: Payer: Self-pay | Admitting: Obstetrics & Gynecology

## 2014-10-24 VITALS — BP 105/63 | HR 88 | Wt 166.0 lb

## 2014-10-24 DIAGNOSIS — Z3483 Encounter for supervision of other normal pregnancy, third trimester: Secondary | ICD-10-CM

## 2014-10-24 NOTE — Progress Notes (Signed)
Routine visit. No problems. Quad screen at next visit. Schedule anatomy u/s at next visit.

## 2014-11-21 ENCOUNTER — Ambulatory Visit (INDEPENDENT_AMBULATORY_CARE_PROVIDER_SITE_OTHER): Payer: BLUE CROSS/BLUE SHIELD | Admitting: Obstetrics & Gynecology

## 2014-11-21 VITALS — BP 106/60 | HR 86 | Wt 165.0 lb

## 2014-11-21 DIAGNOSIS — Z36 Encounter for antenatal screening of mother: Secondary | ICD-10-CM | POA: Diagnosis not present

## 2014-11-21 DIAGNOSIS — Z3492 Encounter for supervision of normal pregnancy, unspecified, second trimester: Secondary | ICD-10-CM

## 2014-11-21 NOTE — Progress Notes (Signed)
Quad screen drawn

## 2014-11-21 NOTE — Progress Notes (Signed)
Routine visit. She is starting to feel some flutters. Denies any nausea/vomitting. Reports a good appetite. Husband here confirms good diet. MSAFP today. Schedule anatomy u/s for next week.

## 2014-11-22 LAB — AFP, QUAD SCREEN
AFP: 82.9 ng/mL
Age Alone: 1:947 {titer}
CURR GEST AGE: 17.5 wks.days
Down Syndrome Scr Risk Est: 1:307 {titer}
HCG TOTAL: 120.9 [IU]/mL
INH: 211.1 pg/mL
INTERPRETATION-AFP: NEGATIVE
MOM FOR AFP: 1.91
MOM FOR HCG: 4.32
MOM FOR INH: 1.34
Open Spina bifida: NEGATIVE
Tri 18 Scr Risk Est: NEGATIVE
UE3 VALUE: 0.71 ng/mL
uE3 Mom: 0.6

## 2014-12-01 ENCOUNTER — Ambulatory Visit (HOSPITAL_COMMUNITY)
Admission: RE | Admit: 2014-12-01 | Discharge: 2014-12-01 | Disposition: A | Discharge status: Home / Self Care | Payer: Medicaid Other | Source: Ambulatory Visit | Attending: Obstetrics & Gynecology | Admitting: Obstetrics & Gynecology

## 2014-12-01 ENCOUNTER — Ambulatory Visit (HOSPITAL_COMMUNITY): Payer: BLUE CROSS/BLUE SHIELD

## 2014-12-01 DIAGNOSIS — Z3689 Encounter for other specified antenatal screening: Secondary | ICD-10-CM | POA: Insufficient documentation

## 2014-12-01 DIAGNOSIS — Z3A19 19 weeks gestation of pregnancy: Secondary | ICD-10-CM | POA: Diagnosis not present

## 2014-12-01 DIAGNOSIS — O283 Abnormal ultrasonic finding on antenatal screening of mother: Secondary | ICD-10-CM | POA: Diagnosis not present

## 2014-12-01 DIAGNOSIS — Z36 Encounter for antenatal screening of mother: Secondary | ICD-10-CM | POA: Insufficient documentation

## 2014-12-01 DIAGNOSIS — Z3482 Encounter for supervision of other normal pregnancy, second trimester: Secondary | ICD-10-CM

## 2014-12-01 DIAGNOSIS — Z3492 Encounter for supervision of normal pregnancy, unspecified, second trimester: Secondary | ICD-10-CM

## 2014-12-01 DIAGNOSIS — O0990 Supervision of high risk pregnancy, unspecified, unspecified trimester: Secondary | ICD-10-CM | POA: Insufficient documentation

## 2014-12-01 DIAGNOSIS — O289 Unspecified abnormal findings on antenatal screening of mother: Secondary | ICD-10-CM

## 2014-12-01 DIAGNOSIS — O09899 Supervision of other high risk pregnancies, unspecified trimester: Secondary | ICD-10-CM | POA: Insufficient documentation

## 2014-12-22 ENCOUNTER — Ambulatory Visit (INDEPENDENT_AMBULATORY_CARE_PROVIDER_SITE_OTHER): Payer: Medicaid Other | Admitting: Obstetrics & Gynecology

## 2014-12-22 VITALS — BP 104/62 | HR 101 | Wt 166.0 lb

## 2014-12-22 DIAGNOSIS — R197 Diarrhea, unspecified: Secondary | ICD-10-CM | POA: Diagnosis not present

## 2014-12-22 DIAGNOSIS — F419 Anxiety disorder, unspecified: Secondary | ICD-10-CM

## 2014-12-22 DIAGNOSIS — O99342 Other mental disorders complicating pregnancy, second trimester: Secondary | ICD-10-CM

## 2014-12-22 DIAGNOSIS — Z3482 Encounter for supervision of other normal pregnancy, second trimester: Secondary | ICD-10-CM

## 2014-12-22 MED ORDER — RANITIDINE HCL 150 MG PO TABS: 150.0000 mg | ORAL_TABLET | Freq: Two times a day (BID) | ORAL | Status: DC

## 2014-12-22 NOTE — Progress Notes (Signed)
Subjective:  Bethany Odonnell is a 29 y.o. G3P1011 at [redacted]w[redacted]d being seen today for ongoing prenatal care.  Patient reports anxiety wtih some palpitations and reflux symptoms not resolved with Zantac.  Contractions: Not present.  Vag. Bleeding: None. Movement: Present. Denies leaking of fluid.   The following portions of the patient's history were reviewed and updated as appropriate: allergies, current medications, past family history, past medical history, past social history, past surgical history and problem list.   Objective:   Filed Vitals:   12/22/14 1551  BP: 104/62  Pulse: 101  Weight: 166 lb (75.297 kg)    Fetal Status: Fetal Heart Rate (bpm): 143 Fundal Height: 23 cm Movement: Present     General:  Alert, oriented and cooperative. Patient is in no acute distress.  Skin: Skin is warm and dry. No rash noted.   Cardiovascular: Normal heart rate noted  Respiratory: Normal respiratory effort, no problems with respiration noted  Abdomen: Soft, gravid, appropriate for gestational age. Pain/Pressure: Absent     Vaginal: Vag. Bleeding: None.    Vag D/C Character: Thin  Cervix: Not evaluated        Extremities: Normal range of motion.  Edema: None  Mental Status: Normal mood and affect. Normal behavior. Normal judgment and thought content.   Urinalysis: Urine Protein: Negative Urine Glucose: Negative  Assessment and Plan:  Pregnancy: G3P1011 at [redacted]w[redacted]d  1. Encounter for supervision of other normal pregnancy in second trimester Routine care--wants water birth--needs to see midwife  3. Anxiety during pregnancy in second trimester, antepartum Referral to [facility]  - Ambulatory referral to Psychiatry   Preterm labor symptoms and general obstetric precautions including but not limited to vaginal bleeding, contractions, leaking of fluid and fetal movement were reviewed in detail with the patient.  Please refer to After Visit Summary for other counseling recommendations.   No Follow-up on  file.   Guss Bunde, MD

## 2015-01-18 ENCOUNTER — Encounter: Payer: Self-pay | Admitting: *Deleted

## 2015-01-19 ENCOUNTER — Ambulatory Visit (INDEPENDENT_AMBULATORY_CARE_PROVIDER_SITE_OTHER): Payer: Medicaid Other | Admitting: Obstetrics & Gynecology

## 2015-01-19 ENCOUNTER — Encounter: Payer: Self-pay | Admitting: Obstetrics & Gynecology

## 2015-01-19 VITALS — BP 112/62 | HR 91 | Wt 172.0 lb

## 2015-01-19 DIAGNOSIS — Z3482 Encounter for supervision of other normal pregnancy, second trimester: Secondary | ICD-10-CM

## 2015-01-19 NOTE — Progress Notes (Signed)
Subjective:  COCO SHARPNACK is a 29 y.o. G3P1011 at [redacted]w[redacted]d being seen today for ongoing prenatal care.  Patient reports no complaints.  Contractions: Not present.  Vag. Bleeding: None. Movement: Present. Denies leaking of fluid.   The following portions of the patient's history were reviewed and updated as appropriate: allergies, current medications, past family history, past medical history, past social history, past surgical history and problem list.   Objective:   Filed Vitals:   01/19/15 1550  BP: 112/62  Pulse: 91  Weight: 172 lb (78.019 kg)    Fetal Status: Fetal Heart Rate (bpm): 141   Movement: Present     General:  Alert, oriented and cooperative. Patient is in no acute distress.  Skin: Skin is warm and dry. No rash noted.   Cardiovascular: Normal heart rate noted  Respiratory: Normal respiratory effort, no problems with respiration noted  Abdomen: Soft, gravid, appropriate for gestational age. Pain/Pressure: Present     Vaginal: Vag. Bleeding: None.    Vag D/C Character: Thin  Cervix: Not evaluated        Extremities: Normal range of motion.  Edema: None  Mental Status: Normal mood and affect. Normal behavior. Normal judgment and thought content.   Urinalysis: Urine Protein: Negative Urine Glucose: Negative  Assessment and Plan:  Pregnancy: G3P1011 at [redacted]w[redacted]d  1. Encounter for supervision of other normal pregnancy in second trimester  - US OB Follow Up; Future  Preterm labor symptoms and general obstetric precautions including but not limited to vaginal bleeding, contractions, leaking of fluid and fetal movement were reviewed in detail with the patient.  Please refer to After Visit Summary for other counseling recommendations.   Return in about 3 weeks (around 02/09/2015) for glucola, tdap, labs.   Emily Filbert, MD

## 2015-01-23 ENCOUNTER — Ambulatory Visit (HOSPITAL_COMMUNITY)
Admission: RE | Admit: 2015-01-23 | Discharge: 2015-01-23 | Disposition: A | Discharge status: Home / Self Care | Payer: Medicaid Other | Source: Ambulatory Visit | Attending: Obstetrics & Gynecology | Admitting: Obstetrics & Gynecology

## 2015-01-23 DIAGNOSIS — Z3482 Encounter for supervision of other normal pregnancy, second trimester: Secondary | ICD-10-CM | POA: Insufficient documentation

## 2015-01-27 ENCOUNTER — Ambulatory Visit (HOSPITAL_COMMUNITY): Payer: Self-pay

## 2015-02-14 ENCOUNTER — Ambulatory Visit (INDEPENDENT_AMBULATORY_CARE_PROVIDER_SITE_OTHER): Payer: Medicaid Other | Admitting: Obstetrics & Gynecology

## 2015-02-14 VITALS — BP 103/70 | HR 97 | Wt 173.0 lb

## 2015-02-14 DIAGNOSIS — Z36 Encounter for antenatal screening of mother: Secondary | ICD-10-CM

## 2015-02-14 DIAGNOSIS — Z23 Encounter for immunization: Secondary | ICD-10-CM | POA: Diagnosis not present

## 2015-02-14 DIAGNOSIS — Z3483 Encounter for supervision of other normal pregnancy, third trimester: Secondary | ICD-10-CM

## 2015-02-14 MED ORDER — TETANUS-DIPHTH-ACELL PERTUSSIS 5-2.5-18.5 LF-MCG/0.5 IM SUSP
0.5000 mL | Freq: Once | INTRAMUSCULAR | Status: AC
  Administered 2015-02-14: 0.5 mL via INTRAMUSCULAR

## 2015-02-14 MED ORDER — INFLUENZA VAC SPLIT QUAD 0.5 ML IM SUSY: 0.5000 mL | PREFILLED_SYRINGE | Freq: Once | INTRAMUSCULAR | Status: DC

## 2015-02-14 NOTE — Progress Notes (Signed)
Subjective:  Bethany Odonnell is a 29 y.o. G3P1011 at [redacted]w[redacted]d being seen today for ongoing prenatal care.  Patient reports no complaints.  Contractions: Not present.  Vag. Bleeding: None. Movement: Present. Denies leaking of fluid.   The following portions of the patient's history were reviewed and updated as appropriate: allergies, current medications, past family history, past medical history, past social history, past surgical history and problem list.   Objective:   Filed Vitals:   02/14/15 1533  BP: 103/70  Pulse: 97  Weight: 173 lb (78.472 kg)    Fetal Status: Fetal Heart Rate (bpm): 140   Movement: Present     General:  Alert, oriented and cooperative. Patient is in no acute distress.  Skin: Skin is warm and dry. No rash noted.   Cardiovascular: Normal heart rate noted  Respiratory: Normal respiratory effort, no problems with respiration noted  Abdomen: Soft, gravid, appropriate for gestational age. Pain/Pressure: Present     Pelvic: Vag. Bleeding: None Vag D/C Character: Thin   Cervical exam deferred        Extremities: Normal range of motion.  Edema: Trace  Mental Status: Normal mood and affect. Normal behavior. Normal judgment and thought content.   Urinalysis: Urine Protein: Negative Urine Glucose: Trace  Assessment and Plan:  Pregnancy: G3P1011 at [redacted]w[redacted]d  1. Encounter for supervision of other normal pregnancy in third trimester  - Glucose Tolerance, 1 HR (50g) - Tdap (BOOSTRIX) injection 0.5 mL; Inject 0.5 mLs into the muscle once. - Influenza vac split quadrivalent PF (FLUARIX) injection 0.5 mL; Inject 0.5 mLs into the muscle once. - CBC - HIV antibody (with reflex) - RPR - [place] MFM OB FOLLOW UP; Future  Preterm labor symptoms and general obstetric precautions including but not limited to vaginal bleeding, contractions, leaking of fluid and fetal movement were reviewed in detail with the patient. Please refer to After Visit Summary for other counseling  recommendations.  Return in about 3 weeks (around 03/07/2015).   Emily Filbert, MD

## 2015-02-15 ENCOUNTER — Telehealth: Payer: Self-pay | Admitting: *Deleted

## 2015-02-15 LAB — GLUCOSE TOLERANCE, 1 HOUR (50G) W/O FASTING: GLUCOSE 1 HOUR GTT: 71 mg/dL (ref 70–140)

## 2015-02-15 LAB — CBC
HEMATOCRIT: 30.6 % — AB (ref 36.0–46.0)
Hemoglobin: 10.1 g/dL — ABNORMAL LOW (ref 12.0–15.0)
MCH: 30.3 pg (ref 26.0–34.0)
MCHC: 33 g/dL (ref 30.0–36.0)
MCV: 91.9 fL (ref 78.0–100.0)
MPV: 11.2 fL (ref 8.6–12.4)
PLATELETS: 200 10*3/uL (ref 150–400)
RBC: 3.33 MIL/uL — AB (ref 3.87–5.11)
RDW: 13.2 % (ref 11.5–15.5)
WBC: 11.7 10*3/uL — AB (ref 4.0–10.5)

## 2015-02-15 LAB — HIV ANTIBODY (ROUTINE TESTING W REFLEX): HIV 1&2 Ab, 4th Generation: NONREACTIVE

## 2015-02-15 LAB — RPR

## 2015-02-15 NOTE — Telephone Encounter (Signed)
LM on voicemail of normal 1 hr GTT. 

## 2015-02-23 ENCOUNTER — Ambulatory Visit (INDEPENDENT_AMBULATORY_CARE_PROVIDER_SITE_OTHER): Payer: Medicaid Other | Admitting: Obstetrics & Gynecology

## 2015-02-23 ENCOUNTER — Encounter: Payer: Self-pay | Admitting: Obstetrics & Gynecology

## 2015-02-23 VITALS — BP 114/69 | HR 100 | Wt 173.0 lb

## 2015-02-23 DIAGNOSIS — Z3483 Encounter for supervision of other normal pregnancy, third trimester: Secondary | ICD-10-CM

## 2015-02-23 DIAGNOSIS — R002 Palpitations: Secondary | ICD-10-CM

## 2015-02-23 DIAGNOSIS — O368131 Decreased fetal movements, third trimester, fetus 1: Secondary | ICD-10-CM

## 2015-02-23 MED ORDER — RANITIDINE HCL 150 MG PO TABS: 150.0000 mg | ORAL_TABLET | Freq: Two times a day (BID) | ORAL | Status: DC

## 2015-02-23 MED ORDER — CYCLOBENZAPRINE HCL 10 MG PO TABS: ORAL_TABLET | ORAL | Status: DC

## 2015-02-23 NOTE — Patient Instructions (Signed)
Ranitidine tablets or capsules What is this medicine? RANITIDINE (ra NYE te deen) is a type of antihistamine that blocks the release of stomach acid. It is used to treat stomach or intestinal ulcers. It can relieve ulcer pain and discomfort, and the heartburn from acid reflux. This medicine may be used for other purposes; ask your health care provider or pharmacist if you have questions. COMMON BRAND NAME(S): Acid Reducer, Taladine, Zantac, Zantac 150, Zantac 75 What should I tell my health care provider before I take this medicine? They need to know if you have any of these conditions: -kidney disease -liver disease -porphyria -an unusual or allergic reaction to ranitidine, other medicines, foods, dyes, or preservatives -pregnant or trying to get pregnant -breast-feeding How should I use this medicine? Take this medicine by mouth with a glass of water. Follow the directions on the prescription label. If you only take this medicine once a day, take it at bedtime. Take your medicine at regular intervals. Do not take your medicine more often than directed. Do not stop taking except on your doctor's advice. Talk to your pediatrician regarding the use of this medicine in children. Special care may be needed. Overdosage: If you think you have taken too much of this medicine contact a poison control center or emergency room at once. NOTE: This medicine is only for you. Do not share this medicine with others. What if I miss a dose? If you miss a dose, take it as soon as you can. If it is almost time for your next dose, take only that dose. Do not take double or extra doses. What may interact with this medicine? -atazanavir -delavirdine -gefitinib -glipizide -ketoconazole -midazolam -procainamide -propantheline -triazolam -warfarin This list may not describe all possible interactions. Give your health care provider a list of all the medicines, herbs, non-prescription drugs, or dietary  supplements you use. Also tell them if you smoke, drink alcohol, or use illegal drugs. Some items may interact with your medicine. What should I watch for while using this medicine? Tell your doctor or health care professional if your condition does not start to get better or gets worse. You may need to take this medicine for several days as prescribed before your symptoms get better. Finish the full course of tablets prescribed, even if you feel better. Do not smoke cigarettes or drink alcohol. These increase irritation in your stomach and can lengthen the time it will take for ulcers to heal. Cigarettes and alcohol can also make acid reflux or heartburn worse. If you get black, tarry stools or vomit up what looks like coffee grounds, call your doctor or health care professional at once. You may have a bleeding ulcer. What side effects may I notice from receiving this medicine? Side effects that you should report to your doctor or health care professional as soon as possible: -agitation, nervousness, depression, hallucinations -allergic reactions like skin rash, itching or hives, swelling of the face, lips, or tongue -breast enlargement in both males and females -breathing problems -redness, blistering, peeling or loosening of the skin, including inside the mouth -unusual bleeding or bruising -unusually weak or tired -vomiting -yellowing of the skin or eyes Side effects that usually do not require medical attention (report to your doctor or health care professional if they continue or are bothersome): -constipation or diarrhea -dizziness -headache -nausea This list may not describe all possible side effects. Call your doctor for medical advice about side effects. You may report side effects to FDA at 1-800-FDA-1088.  Where should I keep my medicine? Keep out of the reach of children. Store at room temperature between 15 and 30 degrees C (59 and 86 degrees F). Protect from light and moisture.  Keep container tightly closed. Throw away any unused medicine after the expiration date. NOTE: This sheet is a summary. It may not cover all possible information. If you have questions about this medicine, talk to your doctor, pharmacist, or health care provider.  2015, Elsevier/Gold Standard. (2012-09-23 14:50:34) Supraventricular Tachycardia Supraventricular tachycardia (SVT) is when the heart beats very fast. SVT can last for a long time (sustained) or it can start and stop suddenly (nonsustained). HOME CARE   Take your heart medicine as told by your doctor. Check with your doctor before taking cold, diet, or herbal medicine.  Do not smoke.  Do not drink large amounts of caffeine.Caffeine is found in coffee, tea, soda (pop, cola), and chocolate.  Keep all doctor visits as told. GET HELP RIGHT AWAY IF:   You have chest pain or pressure.  You cannot catch your breath.  You are dizzy or lightheaded.  You feel like you will pass out (faint).  You are sweaty (diaphoretic) and feel sick to your stomach (nauseous) or throw up (vomit).  If you have the above problems, call your local emergency services (911 in [place] right away. Do not drive yourself to the hospital. MAKE SURE YOU:   Understand these instructions.  Will watch your condition.  Will get help right away if you are not doing well or get worse. Document Released: 06/03/2005 Document Revised: 08/26/2011 Document Reviewed: 09/07/2008 [facility] Patient Information 2015 [place], [place]. This information is not intended to replace advice given to you by your health care provider. Make sure you discuss any questions you have with your health care provider.

## 2015-02-23 NOTE — Progress Notes (Signed)
Pt c/o headaches, increased swelling in hands/feet, and upper back/right side pain.

## 2015-02-23 NOTE — Progress Notes (Signed)
Subjective:  Bethany Odonnell is a 29 y.o. G3P1011 at [redacted]w[redacted]d being seen today for ongoing prenatal care.  Patient reports backache, headache and reflux symptoms and heart palpitations.  Contractions: Not present.  Vag. Bleeding: None. Movement: (!) Decreased. Denies leaking of fluid.   The following portions of the patient's history were reviewed and updated as appropriate: allergies, current medications, past family history, past medical history, past social history, past surgical history and problem list.   Objective:   Filed Vitals:   02/23/15 1453  BP: 114/69  Pulse: 100  Weight: 173 lb (78.472 kg)    Fetal Status: Fetal Heart Rate (bpm): 143 Fundal Height: 31 cm Movement: (!) Decreased     General:  Alert, oriented and cooperative. Patient is in no acute distress.  Skin: Skin is warm and dry. No rash noted.   Cardiovascular: Normal heart rate noted  Respiratory: Normal respiratory effort, no problems with respiration noted  Abdomen: Soft, gravid, appropriate for gestational age. Pain/Pressure: Absent     Pelvic: Vag. Bleeding: None Vag D/C Character: Thin   Cervical exam deferred        Extremities: Normal range of motion.  Edema: Trace  Mental Status: Normal mood and affect. Normal behavior. Normal judgment and thought content.  Back--behind right scapula is tense muscle that is painful, (headache is on right side) Urinalysis: Urine Protein: Negative Urine Glucose: Negative  Assessment and Plan:  Pregnancy: G3P1011 at [redacted]w[redacted]d  1. Encounter for supervision of other normal pregnancy in third trimester Zantac for reflux Flexeril, hot packs, pregnancy massage for right scapula muscle spasm NST for decreased fetal movement. NST reactive  2. Heart palpitations Instructions for valsalva Referral to cards (prob SVT) No syncope yet but does become lightheaded. - Ambulatory referral to Cardiology  Term labor symptoms and general obstetric precautions including but not limited to  vaginal bleeding, contractions, leaking of fluid and fetal movement were reviewed in detail with the patient. Please refer to After Visit Summary for other counseling recommendations.  Return in about 2 weeks (around 03/09/2015).   Guss Bunde, MD

## 2015-03-07 ENCOUNTER — Ambulatory Visit (HOSPITAL_COMMUNITY)
Admission: RE | Admit: 2015-03-07 | Discharge: 2015-03-07 | Disposition: A | Discharge status: Home / Self Care | Payer: BLUE CROSS/BLUE SHIELD | Source: Ambulatory Visit | Attending: Obstetrics & Gynecology | Admitting: Obstetrics & Gynecology

## 2015-03-07 ENCOUNTER — Other Ambulatory Visit: Payer: Self-pay | Admitting: Obstetrics & Gynecology

## 2015-03-07 DIAGNOSIS — O283 Abnormal ultrasonic finding on antenatal screening of mother: Secondary | ICD-10-CM | POA: Diagnosis not present

## 2015-03-07 DIAGNOSIS — O289 Unspecified abnormal findings on antenatal screening of mother: Secondary | ICD-10-CM

## 2015-03-07 DIAGNOSIS — Z3A32 32 weeks gestation of pregnancy: Secondary | ICD-10-CM

## 2015-03-07 DIAGNOSIS — Z3483 Encounter for supervision of other normal pregnancy, third trimester: Secondary | ICD-10-CM

## 2015-03-09 ENCOUNTER — Ambulatory Visit: Payer: Medicaid Other | Admitting: Obstetrics & Gynecology

## 2015-03-09 VITALS — BP 115/66 | HR 99 | Wt 179.0 lb

## 2015-03-09 DIAGNOSIS — Z3483 Encounter for supervision of other normal pregnancy, third trimester: Secondary | ICD-10-CM

## 2015-03-22 ENCOUNTER — Encounter (HOSPITAL_COMMUNITY): Payer: Self-pay

## 2015-03-22 ENCOUNTER — Inpatient Hospital Stay (HOSPITAL_COMMUNITY)
Admission: AD | Admit: 2015-03-22 | Discharge: 2015-03-23 | Disposition: A | Discharge status: Home / Self Care | Payer: Medicaid Other | Source: Ambulatory Visit | Attending: Obstetrics & Gynecology | Admitting: Obstetrics & Gynecology

## 2015-03-22 DIAGNOSIS — R51 Headache: Secondary | ICD-10-CM | POA: Diagnosis present

## 2015-03-22 DIAGNOSIS — E860 Dehydration: Secondary | ICD-10-CM | POA: Insufficient documentation

## 2015-03-22 DIAGNOSIS — K219 Gastro-esophageal reflux disease without esophagitis: Secondary | ICD-10-CM | POA: Diagnosis not present

## 2015-03-22 DIAGNOSIS — J029 Acute pharyngitis, unspecified: Secondary | ICD-10-CM | POA: Insufficient documentation

## 2015-03-22 LAB — URINE MICROSCOPIC-ADD ON

## 2015-03-22 LAB — URINALYSIS, ROUTINE W REFLEX MICROSCOPIC
GLUCOSE, UA: NEGATIVE mg/dL
LEUKOCYTES UA: NEGATIVE
NITRITE: NEGATIVE
PH: 6 (ref 5.0–8.0)
PROTEIN: NEGATIVE mg/dL
Specific Gravity, Urine: >1.03 — ABNORMAL HIGH (ref 1.005–1.030)
Urobilinogen, UA: 1 mg/dL (ref 0.0–1.0)

## 2015-03-22 NOTE — MAU Note (Signed)
Pt reports sore throat and a headache. States she is having pressure in her lower abd.Marland Kitchen

## 2015-03-22 NOTE — MAU Provider Note (Signed)
History   Patient is a 29 year old G89P1011 presenting with a history of sore throat and headache. She reports symptoms began in the a.m around 11 when she got back from work. She has been unable to drink or eat very much due to the pain she feels at the back of her throat. Patient also reports a subjective fever at home. Patient denies sick contacts or recent illness. 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 Patient also denies any vaginal discharge, bleeding, LOF, or dizziness. She feels baby moving frequently, and has regular braxton-hicks contractions.  CSN: [id number]  Arrival date and time: 03/22/15 2150   None     No chief complaint on file.  HPI  OB History    Gravida Para Term Preterm AB TAB SAB Ectopic Multiple Living   3 1 1  1     1       Past Medical History  Diagnosis Date  . GERD (gastroesophageal reflux disease)   . Scoliosis   . IBS (irritable bowel syndrome)   . Anxiety     Past Surgical History  Procedure Laterality Date  . Induced abortion      Family History  Problem Relation Age of Onset  . Alcohol abuse Neg Hx     Social History  Substance Use Topics  . Smoking status: Never Smoker   . Smokeless tobacco: Never Used  . Alcohol Use: Yes     Comment: occasionally    Allergies:  Allergies  Allergen Reactions  . Amoxicillin Nausea And Vomiting  . Penicillins Other (See Comments)    Dizzy, Stomach pain    Facility-administered medications prior to admission  Medication Dose Odonnell Frequency Provider Last Rate Last Dose  . Influenza vac split quadrivalent PF (FLUARIX) injection 0.5 mL  0.5 mL Intramuscular Once Bethany Filbert, MD   0.5 mL at 02/14/15 1545   Prescriptions prior to admission  Medication Sig Dispense Refill Last Dose  . calcium carbonate (TUMS - DOSED IN MG ELEMENTAL CALCIUM) 500 MG chewable tablet Chew 2 tablets by mouth every 4 (four) hours as needed for heartburn (for stomach).   03/22/2015 at Unknown time  .  PRENATAL VIT W/FE-METHYLFOL-FA PO Take by mouth.   Past Week at Unknown time  . ranitidine (ZANTAC) 150 MG tablet Take 1 tablet (150 mg total) by mouth 2 (two) times daily. 60 tablet 6 Past Week at Unknown time  . cyclobenzaprine (FLEXERIL) 10 MG tablet Take one tablet at night for muscle spasm 30 tablet 0   . promethazine (PHENERGAN) 25 MG tablet Take 0.5-1 tablets (12.5-25 mg total) by mouth every 6 (six) hours as needed. 30 tablet 0 More than a month at Unknown time    Review of Systems  Constitutional: Positive for fever and malaise/fatigue. Negative for chills and weight loss.  HENT: Positive for sore throat.   Eyes: Negative for blurred vision, double vision and photophobia.  Gastrointestinal: Positive for heartburn and nausea. Negative for vomiting, abdominal pain, diarrhea, constipation and blood in stool.  Genitourinary: Negative for dysuria.  Neurological: Positive for headaches. Negative for dizziness, tingling and tremors.   Physical Exam   Blood pressure 110/65, pulse 114, temperature 98.6 F (37 C), temperature source Oral, resp. rate 16, last menstrual period 07/20/2014, SpO2 98 %.  Physical Exam  Constitutional: She appears well-developed and well-nourished. No distress.  HENT:  Head: Normocephalic.  Nose: Nose normal.  Mouth/Throat: Oropharynx is clear and moist. No oropharyngeal exudate.  Eyes: Pupils are equal, round, and reactive to light.  Cardiovascular: Normal rate, regular rhythm, normal heart sounds and intact distal pulses.  Exam reveals no gallop and no friction rub.   No murmur heard. Respiratory: Effort normal and breath sounds normal. No respiratory distress. She has no wheezes. She has no rales. She exhibits no tenderness.  Lymphadenopathy:    She has no cervical adenopathy.    MAU Course  Procedures    Assessment and Plan  Patient is a 29 year old G3P1011 with a past medical history significant for GERD presenting with a 1 day history of sore  throat and headaches. She is mildly dehydrated but otherwise in no acute physical distress. DDx includes strep pharyngitis, viral pharyngitis or para-tonsillar abscess. Leading differential at this time is bacterial vs viral pharyngitis due to acute onset of symptoms. Will obtain a rapid strep test to help narrow down diagnosis.  PLAN  - Give patient PO hydration fluids in MAU and encourage proper hydration at home. - Prescribe muscle relaxant (flexeril) to be picked up local pharmacy for back pain and neck pain.   Bethany Apo MS 3 03/22/2015, 11:01 PM   CNM attestation:   Bethany Odonnell is a 29 y.o. G3P1011 reporting sore throat & H/A +FM, denies LOF, VB, contractions, vaginal discharge.  PE: BP 115/69 mmHg  Pulse 92  Temp(Src) 98.6 F (37 C) (Oral)  Resp 16  SpO2 98%  LMP 07/20/2014 Gen: calm comfortable, NAD Resp: normal effort, no distress Abd: gravid  ROS, labs, PMH reviewed NST reactive 130s, +accels, no decels No ctx per toco  Plan: - rapid strep A pending - increase fluids - continue routine follow up in OB clinic  Bethany Odonnell, CNM 12:22 AM  03/23/2015

## 2015-03-22 NOTE — Discharge Instructions (Signed)
It was a pleasure seeing you today in the MAU. Today we discussed your sore throat. Here is the treatment plan we have discussed and agreed upon together:   - We have obtained a "Rapid Strep Screen" for strep throat. Unfortunately due to the time of night we will not have these results back until tomorrow. If the test is positive we will contact you.  - Staying well hydrated is critical for both you and your baby. I think making an effort to push fluid intake is going to take some motivation but you will feel better in the long run. Do your best to drink enough water to keep your urine as close to clear in color as possible. - Your head ache is likely associated with your mild dehydration. Keeping your fluids up will also help this. However, I think the Flexeril you were previously prescribed may also help this.  - Also, using heat or cold packs on your upper back and neck may also help with this discomfort. - You can take Tylenol for fevers, chills, and some pain control.  - Please continue taking the Zantac you have prescribed for your symptoms of acid reflux.

## 2015-03-23 ENCOUNTER — Ambulatory Visit (INDEPENDENT_AMBULATORY_CARE_PROVIDER_SITE_OTHER): Payer: Medicaid Other | Admitting: Obstetrics & Gynecology

## 2015-03-23 VITALS — BP 115/70 | HR 97 | Wt 179.0 lb

## 2015-03-23 DIAGNOSIS — O0990 Supervision of high risk pregnancy, unspecified, unspecified trimester: Secondary | ICD-10-CM

## 2015-03-23 DIAGNOSIS — O09899 Supervision of other high risk pregnancies, unspecified trimester: Secondary | ICD-10-CM

## 2015-03-23 DIAGNOSIS — O281 Abnormal biochemical finding on antenatal screening of mother: Secondary | ICD-10-CM

## 2015-03-23 DIAGNOSIS — Z3483 Encounter for supervision of other normal pregnancy, third trimester: Secondary | ICD-10-CM

## 2015-03-23 DIAGNOSIS — O289 Unspecified abnormal findings on antenatal screening of mother: Secondary | ICD-10-CM

## 2015-03-23 LAB — RAPID STREP SCREEN (MED CTR MEBANE ONLY): Streptococcus, Group A Screen (Direct): NEGATIVE

## 2015-03-23 NOTE — Progress Notes (Signed)
Subjective:  EZELLA KELL is a 29 y.o. G3P1011 at [redacted]w[redacted]d being seen today for ongoing prenatal care.  Patient reports no complaints.  Contractions: Irritability.  Vag. Bleeding: None. Movement: Present. Denies leaking of fluid.   The following portions of the patient's history were reviewed and updated as appropriate: allergies, current medications, past family history, past medical history, past social history, past surgical history and problem list.   Objective:   Filed Vitals:   03/23/15 1534  BP: 115/70  Pulse: 97  Weight: 179 lb (81.194 kg)    Fetal Status: Fetal Heart Rate (bpm): 135 (Simultaneous filing. User may not have seen previous data.) Fundal Height: 34 cm Movement: Present     General:  Alert, oriented and cooperative. Patient is in no acute distress.  Skin: Skin is warm and dry. No rash noted.   Cardiovascular: Normal heart rate noted  Respiratory: Normal respiratory effort, no problems with respiration noted  Abdomen: Soft, gravid, appropriate for gestational age. Pain/Pressure: Present     Pelvic: Vag. Bleeding: None Vag D/C Character: Thin   Cervical exam deferred        Extremities: Normal range of motion.  Edema: Trace  Mental Status: Normal mood and affect. Normal behavior. Normal judgment and thought content.   Urinalysis: Urine Protein: Negative Urine Glucose: Negative  Assessment and Plan:  Pregnancy: G3P1011 at [redacted]w[redacted]d  1. High risk pregnancy with elevated hCG (human chorionic gonadotropin)  - Flu Vaccine QUAD 36+ mos IM (Fluarix, Quad PF)  2. Encounter for supervision of other normal pregnancy in third trimester - Cervical cultures at next visit  Preterm labor symptoms and general obstetric precautions including but not limited to vaginal bleeding, contractions, leaking of fluid and fetal movement were reviewed in detail with the patient. Please refer to After Visit Summary for other counseling recommendations.  Return in about 1 week (around  03/30/2015) for cervical cultures.   Emily Filbert, MD

## 2015-03-25 LAB — CULTURE, GROUP A STREP: STREP A CULTURE: NEGATIVE

## 2015-03-30 ENCOUNTER — Ambulatory Visit (INDEPENDENT_AMBULATORY_CARE_PROVIDER_SITE_OTHER): Payer: Medicaid Other | Admitting: Obstetrics & Gynecology

## 2015-03-30 ENCOUNTER — Other Ambulatory Visit (HOSPITAL_COMMUNITY)
Admission: RE | Admit: 2015-03-30 | Discharge: 2015-03-30 | Disposition: A | Discharge status: Home / Self Care | Payer: Medicaid Other | Source: Ambulatory Visit | Attending: Obstetrics & Gynecology | Admitting: Obstetrics & Gynecology

## 2015-03-30 VITALS — BP 105/71 | HR 101 | Wt 187.0 lb

## 2015-03-30 DIAGNOSIS — Z113 Encounter for screening for infections with a predominantly sexual mode of transmission: Secondary | ICD-10-CM

## 2015-03-30 DIAGNOSIS — Z3493 Encounter for supervision of normal pregnancy, unspecified, third trimester: Secondary | ICD-10-CM

## 2015-03-30 DIAGNOSIS — O09899 Supervision of other high risk pregnancies, unspecified trimester: Secondary | ICD-10-CM

## 2015-03-30 DIAGNOSIS — Z3483 Encounter for supervision of other normal pregnancy, third trimester: Secondary | ICD-10-CM

## 2015-03-30 DIAGNOSIS — O289 Unspecified abnormal findings on antenatal screening of mother: Secondary | ICD-10-CM

## 2015-03-30 DIAGNOSIS — O281 Abnormal biochemical finding on antenatal screening of mother: Secondary | ICD-10-CM

## 2015-03-30 NOTE — Progress Notes (Signed)
Subjective:  MISCHELE DETTER is a 29 y.o. G3P1011 at [redacted]w[redacted]d being seen today for ongoing prenatal care.  Patient reports backache.  Contractions: Irritability.  Vag. Bleeding: None. Movement: Present. Denies leaking of fluid.   The following portions of the patient's history were reviewed and updated as appropriate: allergies, current medications, past family history, past medical history, past social history, past surgical history and problem list. Problem list updated.  Objective:   Filed Vitals:   03/30/15 1551  BP: 105/71  Pulse: 101  Weight: 187 lb (84.823 kg)    Fetal Status: Fetal Heart Rate (bpm): 134 Fundal Height: 35 cm Movement: Present  Presentation: Vertex  General:  Alert, oriented and cooperative. Patient is in no acute distress.  Skin: Skin is warm and dry. No rash noted.   Cardiovascular: Normal heart rate noted  Respiratory: Normal respiratory effort, no problems with respiration noted  Abdomen: Soft, gravid, appropriate for gestational age. Pain/Pressure: Present     Pelvic: Vag. Bleeding: None Vag D/C Character: Thin   Cervical exam performed Dilation: 1.5 Effacement (%): 50 Station: Ballotable  Extremities: Normal range of motion.  Edema: Trace  Mental Status: Normal mood and affect. Normal behavior. Normal judgment and thought content.   Urinalysis: Urine Protein: Negative Urine Glucose: Negative  Assessment and Plan:  Pregnancy: G3P1011 at [redacted]w[redacted]d  1. Normal pregnancy, third trimester  - Culture, beta strep (group b only) - Urine cytology ancillary only  2. High risk pregnancy with elevated hCG (human chorionic gonadotropin)   3. Encounter for supervision of other normal pregnancy in third trimester   Term labor symptoms and general obstetric precautions including but not limited to vaginal bleeding, contractions, leaking of fluid and fetal movement were reviewed in detail with the patient. Please refer to After Visit Summary for other counseling  recommendations.  Return in about 1 week (around 04/06/2015).   Emily Filbert, MD

## 2015-04-01 LAB — CULTURE, BETA STREP (GROUP B ONLY)

## 2015-04-05 LAB — URINE CYTOLOGY ANCILLARY ONLY
Chlamydia: NEGATIVE
Neisseria Gonorrhea: NEGATIVE

## 2015-04-06 ENCOUNTER — Ambulatory Visit (INDEPENDENT_AMBULATORY_CARE_PROVIDER_SITE_OTHER): Payer: Medicaid Other | Admitting: Obstetrics & Gynecology

## 2015-04-06 VITALS — BP 108/75 | HR 106 | Wt 185.0 lb

## 2015-04-06 DIAGNOSIS — Z3483 Encounter for supervision of other normal pregnancy, third trimester: Secondary | ICD-10-CM

## 2015-04-06 NOTE — Patient Instructions (Signed)
Return to clinic for any obstetric concerns or go to MAU for evaluation  

## 2015-04-06 NOTE — Progress Notes (Signed)
Subjective:  Bethany Odonnell is a 29 y.o. G3P1011 at [redacted]w[redacted]d being seen today for ongoing prenatal care.  Patient reports occasional contractions.  Contractions: Irregular.  Vag. Bleeding: None. Movement: Present. Denies leaking of fluid.   The following portions of the patient's history were reviewed and updated as appropriate: allergies, current medications, past family history, past medical history, past social history, past surgical history and problem list. Problem list updated.  Objective:   Filed Vitals:   04/06/15 1600  BP: 108/75  Pulse: 106  Weight: 185 lb (83.915 kg)    Fetal Status: Fetal Heart Rate (bpm): 135 Fundal Height: 37 cm Movement: Present  Presentation: Vertex  General:  Alert, oriented and cooperative. Patient is in no acute distress.  Skin: Skin is warm and dry. No rash noted.   Cardiovascular: Normal heart rate noted  Respiratory: Normal respiratory effort, no problems with respiration noted  Abdomen: Soft, gravid, appropriate for gestational age. Pain/Pressure: Present     Pelvic: Vag. Bleeding: None Vag D/C Character: Thin   Cervical exam performed Dilation: 2 Effacement (%): 70 Station: -3  Extremities: Normal range of motion.  Edema: Mild pitting, slight indentation  Mental Status: Normal mood and affect. Normal behavior. Normal judgment and thought content.   Urinalysis: Urine Protein: Negative Urine Glucose: Negative  Assessment and Plan:  Pregnancy: G3P1011 at [redacted]w[redacted]d  Encounter for supervision of other normal pregnancy in third trimester Term labor symptoms and general obstetric precautions including but not limited to vaginal bleeding, contractions, leaking of fluid and fetal movement were reviewed in detail with the patient. Please refer to After Visit Summary for other counseling recommendations.  Return in about 1 week (around 04/13/2015) for OB Visit.   Osborne Oman, MD

## 2015-04-13 ENCOUNTER — Ambulatory Visit (INDEPENDENT_AMBULATORY_CARE_PROVIDER_SITE_OTHER): Payer: Medicaid Other | Admitting: Obstetrics & Gynecology

## 2015-04-13 VITALS — BP 104/58 | HR 98 | Wt 185.0 lb

## 2015-04-13 DIAGNOSIS — Z3483 Encounter for supervision of other normal pregnancy, third trimester: Secondary | ICD-10-CM

## 2015-04-13 NOTE — Patient Instructions (Signed)
Return to clinic for any obstetric concerns or go to MAU for evaluation  

## 2015-04-13 NOTE — Progress Notes (Signed)
Subjective:  Bethany Odonnell is a 29 y.o. G3P1011 at [redacted]w[redacted]d being seen today for ongoing prenatal care.  Patient reports occasional contractions, pelvic pressure.  Contractions: Irregular.  Vag. Bleeding: None. Movement: Present. Denies leaking of fluid.   The following portions of the patient's history were reviewed and updated as appropriate: allergies, current medications, past family history, past medical history, past social history, past surgical history and problem list. Problem list updated.  Objective:   Filed Vitals:   04/13/15 1526  BP: 104/58  Pulse: 98  Weight: 185 lb (83.915 kg)    Fetal Status: Fetal Heart Rate (bpm): 141 Fundal Height: 38 cm Movement: Present  Presentation: Vertex  General:  Alert, oriented and cooperative. Patient is in no acute distress.  Skin: Skin is warm and dry. No rash noted.   Cardiovascular: Normal heart rate noted  Respiratory: Normal respiratory effort, no problems with respiration noted  Abdomen: Soft, gravid, appropriate for gestational age. Pain/Pressure: Present     Pelvic: Vag. Bleeding: None Vag D/C Character: White   Cervical exam performed Dilation: 2.5 Effacement (%): 60 Station: -3  Extremities: Normal range of motion.  Edema: Trace  Mental Status: Normal mood and affect. Normal behavior. Normal judgment and thought content.   Urinalysis: Urine Protein: Negative Urine Glucose: Negative  Assessment and Plan:  Pregnancy: G3P1011 at [redacted]w[redacted]d  Encounter for supervision of other normal pregnancy in third trimester Term labor symptoms and general obstetric precautions including but not limited to vaginal bleeding, contractions, leaking of fluid and fetal movement were reviewed in detail with the patient. Please refer to After Visit Summary for other counseling recommendations.  Return in about 1 week (around 04/20/2015) for OB Visit.   Osborne Oman, MD

## 2015-04-19 ENCOUNTER — Ambulatory Visit (INDEPENDENT_AMBULATORY_CARE_PROVIDER_SITE_OTHER): Payer: Medicaid Other | Admitting: Obstetrics & Gynecology

## 2015-04-19 VITALS — BP 112/67 | HR 85 | Wt 186.0 lb

## 2015-04-19 DIAGNOSIS — O368131 Decreased fetal movements, third trimester, fetus 1: Secondary | ICD-10-CM | POA: Diagnosis not present

## 2015-04-19 DIAGNOSIS — Z3483 Encounter for supervision of other normal pregnancy, third trimester: Secondary | ICD-10-CM

## 2015-04-19 NOTE — Progress Notes (Signed)
Subjective:  Bethany Odonnell is a 29 y.o. G3P1011 at [redacted]w[redacted]d being seen today for ongoing prenatal care.  Patient reports decreased fetal movement.  Contractions: Irregular.  Vag. Bleeding: None. Movement: Present. Denies leaking of fluid.   The following portions of the patient's history were reviewed and updated as appropriate: allergies, current medications, past family history, past medical history, past social history, past surgical history and problem list. Problem list updated.  Objective:   Filed Vitals:   04/19/15 0844  BP: 112/67  Pulse: 85  Weight: 186 lb (84.369 kg)    Fetal Status: Fetal Heart Rate (bpm): 138   Movement: Present     General:  Alert, oriented and cooperative. Patient is in no acute distress.  Skin: Skin is warm and dry. No rash noted.   Cardiovascular: Normal heart rate noted  Respiratory: Normal respiratory effort, no problems with respiration noted  Abdomen: Soft, gravid, appropriate for gestational age. Pain/Pressure: Present     Pelvic: Vag. Bleeding: None Vag D/C Character: Thin   Cervical exam performed        Extremities: Normal range of motion.  Edema: Trace  Mental Status: Normal mood and affect. Normal behavior. Normal judgment and thought content.   Urinalysis: Urine Protein: Negative Urine Glucose: Negative  Assessment and Plan:  Pregnancy: G3P1011 at [redacted]w[redacted]d  1. Encounter for supervision of other normal pregnancy in third trimester NST- reviewed and reactive  Term labor symptoms and general obstetric precautions including but not limited to vaginal bleeding, contractions, leaking of fluid and fetal movement were reviewed in detail with the patient. Please refer to After Visit Summary for other counseling recommendations.  Return in about 1 week (around 04/26/2015).   Emily Filbert, MD

## 2015-04-20 ENCOUNTER — Inpatient Hospital Stay (HOSPITAL_COMMUNITY)
Admission: AD | Admit: 2015-04-20 | Discharge: 2015-04-22 | DRG: 775 | Disposition: A | Discharge status: Home / Self Care | Payer: BLUE CROSS/BLUE SHIELD | Source: Ambulatory Visit | Attending: Obstetrics and Gynecology | Admitting: Obstetrics and Gynecology

## 2015-04-20 ENCOUNTER — Inpatient Hospital Stay (HOSPITAL_COMMUNITY): Payer: BLUE CROSS/BLUE SHIELD | Admitting: Anesthesiology

## 2015-04-20 ENCOUNTER — Encounter (HOSPITAL_COMMUNITY): Payer: Self-pay | Admitting: *Deleted

## 2015-04-20 DIAGNOSIS — Z3A39 39 weeks gestation of pregnancy: Secondary | ICD-10-CM

## 2015-04-20 DIAGNOSIS — M419 Scoliosis, unspecified: Secondary | ICD-10-CM | POA: Diagnosis present

## 2015-04-20 DIAGNOSIS — O42013 Preterm premature rupture of membranes, onset of labor within 24 hours of rupture, third trimester: Secondary | ICD-10-CM

## 2015-04-20 DIAGNOSIS — O4292 Full-term premature rupture of membranes, unspecified as to length of time between rupture and onset of labor: Principal | ICD-10-CM | POA: Diagnosis present

## 2015-04-20 DIAGNOSIS — O9962 Diseases of the digestive system complicating childbirth: Secondary | ICD-10-CM | POA: Diagnosis present

## 2015-04-20 DIAGNOSIS — K219 Gastro-esophageal reflux disease without esophagitis: Secondary | ICD-10-CM | POA: Diagnosis present

## 2015-04-20 DIAGNOSIS — K589 Irritable bowel syndrome without diarrhea: Secondary | ICD-10-CM | POA: Diagnosis present

## 2015-04-20 DIAGNOSIS — Z3480 Encounter for supervision of other normal pregnancy, unspecified trimester: Secondary | ICD-10-CM

## 2015-04-20 DIAGNOSIS — Z3483 Encounter for supervision of other normal pregnancy, third trimester: Secondary | ICD-10-CM

## 2015-04-20 HISTORY — DX: Allergic rhinitis, unspecified: J30.9

## 2015-04-20 LAB — CBC
HCT: 30.3 % — ABNORMAL LOW (ref 36.0–46.0)
Hemoglobin: 9.6 g/dL — ABNORMAL LOW (ref 12.0–15.0)
MCH: 27.4 pg (ref 26.0–34.0)
MCHC: 31.7 g/dL (ref 30.0–36.0)
MCV: 86.6 fL (ref 78.0–100.0)
PLATELETS: 227 10*3/uL (ref 150–400)
RBC: 3.5 MIL/uL — AB (ref 3.87–5.11)
RDW: 15.1 % (ref 11.5–15.5)
WBC: 12.3 10*3/uL — AB (ref 4.0–10.5)

## 2015-04-20 LAB — TYPE AND SCREEN
ABO/RH(D): O POS
Antibody Screen: NEGATIVE

## 2015-04-20 LAB — POCT FERN TEST: POCT Fern Test: POSITIVE

## 2015-04-20 LAB — OB RESULTS CONSOLE GBS: STREP GROUP B AG: NEGATIVE

## 2015-04-20 LAB — ABO/RH: ABO/RH(D): O POS

## 2015-04-20 MED ORDER — IBUPROFEN 600 MG PO TABS
600.0000 mg | ORAL_TABLET | Freq: Four times a day (QID) | ORAL | Status: DC
  Administered 2015-04-20 – 2015-04-22 (×6): 600 mg via ORAL
  Filled 2015-04-20 (×6): qty 1

## 2015-04-20 MED ORDER — FENTANYL 2.5 MCG/ML BUPIVACAINE 1/10 % EPIDURAL INFUSION (WH - ANES)
INTRAMUSCULAR | Status: AC
  Administered 2015-04-20: 14 mL/h via EPIDURAL
  Filled 2015-04-20: qty 125

## 2015-04-20 MED ORDER — OXYCODONE-ACETAMINOPHEN 5-325 MG PO TABS: 2.0000 | ORAL_TABLET | ORAL | Status: DC | PRN

## 2015-04-20 MED ORDER — SIMETHICONE 80 MG PO CHEW: 80.0000 mg | CHEWABLE_TABLET | ORAL | Status: DC | PRN

## 2015-04-20 MED ORDER — CITRIC ACID-SODIUM CITRATE 334-500 MG/5ML PO SOLN: 30.0000 mL | ORAL | Status: DC | PRN

## 2015-04-20 MED ORDER — LANOLIN HYDROUS EX OINT: TOPICAL_OINTMENT | CUTANEOUS | Status: DC | PRN

## 2015-04-20 MED ORDER — OXYTOCIN 40 UNITS IN LACTATED RINGERS INFUSION - SIMPLE MED
1.0000 m[IU]/min | INTRAVENOUS | Status: DC
  Administered 2015-04-20: 2 m[IU]/min via INTRAVENOUS

## 2015-04-20 MED ORDER — FENTANYL CITRATE (PF) 100 MCG/2ML IJ SOLN: 100.0000 ug | INTRAMUSCULAR | Status: DC | PRN

## 2015-04-20 MED ORDER — METHYLERGONOVINE MALEATE 0.2 MG PO TABS: 0.2000 mg | ORAL_TABLET | ORAL | Status: DC | PRN

## 2015-04-20 MED ORDER — LACTATED RINGERS IV SOLN
500.0000 mL | INTRAVENOUS | Status: DC | PRN
  Administered 2015-04-20: 500 mL via INTRAVENOUS

## 2015-04-20 MED ORDER — PHENYLEPHRINE 40 MCG/ML (10ML) SYRINGE FOR IV PUSH (FOR BLOOD PRESSURE SUPPORT)
PREFILLED_SYRINGE | INTRAVENOUS | Status: AC
  Filled 2015-04-20: qty 20

## 2015-04-20 MED ORDER — OXYTOCIN BOLUS FROM INFUSION: 500.0000 mL | INTRAVENOUS | Status: DC

## 2015-04-20 MED ORDER — OXYTOCIN 40 UNITS IN LACTATED RINGERS INFUSION - SIMPLE MED
62.5000 mL/h | INTRAVENOUS | Status: DC
  Administered 2015-04-20: 62.5 mL/h via INTRAVENOUS
  Filled 2015-04-20: qty 1000

## 2015-04-20 MED ORDER — WITCH HAZEL-GLYCERIN EX PADS: 1.0000 "application " | MEDICATED_PAD | CUTANEOUS | Status: DC | PRN

## 2015-04-20 MED ORDER — TERBUTALINE SULFATE 1 MG/ML IJ SOLN: 0.2500 mg | Freq: Once | INTRAMUSCULAR | Status: DC | PRN

## 2015-04-20 MED ORDER — LACTATED RINGERS IV SOLN
INTRAVENOUS | Status: DC
  Administered 2015-04-20 (×3): via INTRAVENOUS

## 2015-04-20 MED ORDER — METHYLERGONOVINE MALEATE 0.2 MG/ML IJ SOLN: 0.2000 mg | INTRAMUSCULAR | Status: DC | PRN

## 2015-04-20 MED ORDER — ZOLPIDEM TARTRATE 5 MG PO TABS: 5.0000 mg | ORAL_TABLET | Freq: Every evening | ORAL | Status: DC | PRN

## 2015-04-20 MED ORDER — FERROUS SULFATE 325 (65 FE) MG PO TABS
325.0000 mg | ORAL_TABLET | Freq: Two times a day (BID) | ORAL | Status: DC
  Administered 2015-04-21 (×2): 325 mg via ORAL
  Filled 2015-04-20 (×2): qty 1

## 2015-04-20 MED ORDER — DIPHENHYDRAMINE HCL 25 MG PO CAPS: 25.0000 mg | ORAL_CAPSULE | Freq: Four times a day (QID) | ORAL | Status: DC | PRN

## 2015-04-20 MED ORDER — TETANUS-DIPHTH-ACELL PERTUSSIS 5-2.5-18.5 LF-MCG/0.5 IM SUSP
0.5000 mL | Freq: Once | INTRAMUSCULAR | Status: DC
Start: 2015-04-21 — End: 2015-04-21

## 2015-04-20 MED ORDER — DIPHENHYDRAMINE HCL 50 MG/ML IJ SOLN: 12.5000 mg | INTRAMUSCULAR | Status: DC | PRN

## 2015-04-20 MED ORDER — ONDANSETRON HCL 4 MG/2ML IJ SOLN: 4.0000 mg | Freq: Four times a day (QID) | INTRAMUSCULAR | Status: DC | PRN

## 2015-04-20 MED ORDER — OXYCODONE-ACETAMINOPHEN 5-325 MG PO TABS: 1.0000 | ORAL_TABLET | ORAL | Status: DC | PRN

## 2015-04-20 MED ORDER — SENNOSIDES-DOCUSATE SODIUM 8.6-50 MG PO TABS
2.0000 | ORAL_TABLET | ORAL | Status: DC
  Administered 2015-04-20 – 2015-04-21 (×2): 2 via ORAL
  Filled 2015-04-20 (×2): qty 2

## 2015-04-20 MED ORDER — DIBUCAINE 1 % RE OINT: 1.0000 "application " | TOPICAL_OINTMENT | RECTAL | Status: DC | PRN

## 2015-04-20 MED ORDER — LIDOCAINE HCL (PF) 1 % IJ SOLN
30.0000 mL | INTRAMUSCULAR | Status: DC | PRN
  Filled 2015-04-20: qty 30

## 2015-04-20 MED ORDER — FENTANYL 2.5 MCG/ML BUPIVACAINE 1/10 % EPIDURAL INFUSION (WH - ANES)
14.0000 mL/h | INTRAMUSCULAR | Status: DC | PRN
  Administered 2015-04-20 (×3): 14 mL/h via EPIDURAL
  Filled 2015-04-20: qty 125

## 2015-04-20 MED ORDER — PRENATAL MULTIVITAMIN CH
1.0000 | ORAL_TABLET | Freq: Every day | ORAL | Status: DC
  Administered 2015-04-21: 1 via ORAL
  Filled 2015-04-20: qty 1

## 2015-04-20 MED ORDER — FLEET ENEMA 7-19 GM/118ML RE ENEM: 1.0000 | ENEMA | Freq: Every day | RECTAL | Status: DC | PRN

## 2015-04-20 MED ORDER — ACETAMINOPHEN 325 MG PO TABS: 650.0000 mg | ORAL_TABLET | ORAL | Status: DC | PRN

## 2015-04-20 MED ORDER — ONDANSETRON HCL 4 MG/2ML IJ SOLN: 4.0000 mg | INTRAMUSCULAR | Status: DC | PRN

## 2015-04-20 MED ORDER — BENZOCAINE-MENTHOL 20-0.5 % EX AERO
1.0000 "application " | INHALATION_SPRAY | CUTANEOUS | Status: DC | PRN
  Administered 2015-04-20: 1 via TOPICAL
  Filled 2015-04-20: qty 56

## 2015-04-20 MED ORDER — BISACODYL 10 MG RE SUPP: 10.0000 mg | Freq: Every day | RECTAL | Status: DC | PRN

## 2015-04-20 MED ORDER — EPHEDRINE 5 MG/ML INJ
10.0000 mg | INTRAVENOUS | Status: DC | PRN
  Filled 2015-04-20: qty 2

## 2015-04-20 MED ORDER — MEASLES, MUMPS & RUBELLA VAC ~~LOC~~ INJ: 0.5000 mL | INJECTION | Freq: Once | SUBCUTANEOUS | Status: DC

## 2015-04-20 MED ORDER — PHENYLEPHRINE 40 MCG/ML (10ML) SYRINGE FOR IV PUSH (FOR BLOOD PRESSURE SUPPORT)
80.0000 ug | PREFILLED_SYRINGE | INTRAVENOUS | Status: DC | PRN
  Filled 2015-04-20: qty 2

## 2015-04-20 MED ORDER — LIDOCAINE HCL (PF) 1 % IJ SOLN
INTRAMUSCULAR | Status: DC | PRN
  Administered 2015-04-20 (×2): 4 mL via EPIDURAL

## 2015-04-20 MED ORDER — ONDANSETRON HCL 4 MG PO TABS
4.0000 mg | ORAL_TABLET | ORAL | Status: DC | PRN
Start: 2015-04-20 — End: 2015-04-22

## 2015-04-20 MED ORDER — OXYTOCIN 40 UNITS IN LACTATED RINGERS INFUSION - SIMPLE MED: 62.5000 mL/h | INTRAVENOUS | Status: DC | PRN

## 2015-04-20 MED ORDER — FLEET ENEMA 7-19 GM/118ML RE ENEM: 1.0000 | ENEMA | RECTAL | Status: DC | PRN

## 2015-04-20 NOTE — Progress Notes (Signed)
Patient ID: Bethany Odonnell, female   DOB: 1985/11/01, 29 y.o.   MRN: [id number] [place]HA [place] is a 29 y.o. G3P1011 at [redacted]w[redacted]d.  Subjective: Comfortable w/ epidural.  Objective: BP 112/65 mmHg  Pulse 66  Temp(Src) 98.3 F (36.8 C) (Oral)  Resp 16  Ht 5\' 7"  (1.702 m)  Wt 185 lb (83.915 kg)  BMI 28.97 kg/m2  SpO2 99%  LMP 07/20/2014   FHT:  FHR: 125 bpm, variability: mod,  accelerations:  15x15,  decelerations:  none UC:   Q 2-4 minutes, strong  Dilation: 6 Effacement (%): 100 Cervical Position: Anterior Station: 0 Presentation: Vertex Exam by:: Marlou Porch CNM  Labs: Results for orders placed or performed during the hospital encounter of 04/20/15 (from the past 24 hour(s))  Fern Test     Status: None   Collection Time: 04/20/15  7:58 AM  Result Value Ref Range   POCT Fern Test Positive = ruptured amniotic membanes   OB RESULT CONSOLE Group B Strep     Status: None   Collection Time: 04/20/15  8:19 AM  Result Value Ref Range   GBS Negative   CBC     Status: Abnormal   Collection Time: 04/20/15  9:30 AM  Result Value Ref Range   WBC 12.3 (H) 4.0 - 10.5 K/uL   RBC 3.50 (L) 3.87 - 5.11 MIL/uL   Hemoglobin 9.6 (L) 12.0 - 15.0 g/dL   HCT 30.3 (L) 36.0 - 46.0 %   MCV 86.6 78.0 - 100.0 fL   MCH 27.4 26.0 - 34.0 pg   MCHC 31.7 30.0 - 36.0 g/dL   RDW 15.1 11.5 - 15.5 %   Platelets 227 150 - 400 K/uL  Type and screen [place]     Status: None   Collection Time: 04/20/15  9:30 AM  Result Value Ref Range   ABO/RH(D) O POS    Antibody Screen NEG    Sample Expiration 04/23/2015   ABO/Rh     Status: None   Collection Time: 04/20/15  9:30 AM  Result Value Ref Range   ABO/RH(D) O POS     Assessment / Plan: [redacted]w[redacted]d week IUP Labor: active Fetal Wellbeing:  Category I Pain Control:  Epidural Anticipated MOD:  SVD  [place], CNM 04/20/2015 5:40 PM

## 2015-04-20 NOTE — Anesthesia Procedure Notes (Signed)
Epidural Patient location during procedure: OB Start time: 04/20/2015 2:36 PM  Staffing Anesthesiologist: Josephine Igo Performed by: anesthesiologist   Preanesthetic Checklist Completed: patient identified, site marked, surgical consent, pre-op evaluation, timeout performed, IV checked, risks and benefits discussed and monitors and equipment checked  Epidural Patient position: sitting Prep: site prepped and draped and DuraPrep Patient monitoring: continuous pulse ox and blood pressure Approach: midline Location: L3-L4 Injection technique: LOR air  Needle:  Needle type: Tuohy  Needle gauge: 17 G Needle length: 9 cm and 9 Needle insertion depth: 5 cm cm Catheter type: closed end flexible Catheter size: 19 Gauge Catheter at skin depth: 10 cm Test dose: negative and Other  Assessment Events: blood not aspirated, injection not painful, no injection resistance, negative IV test and no paresthesia  Additional Notes Patient identified. Risks and benefits discussed including failed block, incomplete  Pain control, post dural puncture headache, nerve damage, paralysis, blood pressure Changes, nausea, vomiting, reactions to medications-both toxic and allergic and post Partum back pain. All questions were answered. Patient expressed understanding and wished to proceed. Sterile technique was used throughout procedure. Epidural site was Dressed with sterile barrier dressing. No paresthesias, signs of intravascular injection Or signs of intrathecal spread were encountered.  Patient was more comfortable after the epidural was dosed. Please see RN's note for documentation of vital signs and FHR which are stable.

## 2015-04-20 NOTE — MAU Note (Signed)
Intermittent uc's since 0530, felt leaking around 0650, has continued to have leaking, clear fluid.  Denies bleeding.

## 2015-04-20 NOTE — H&P (Signed)
LABOR AND DELIVERY ADMISSION HISTORY AND PHYSICAL NOTE  Bethany Odonnell is a 29 y.o. female G87P1011 with IUP at [redacted]w[redacted]d by LMP and ultrasound presents for spontaneous rupture of membranes. [name] reports intermittent contractions for the past couple of day, which increased in intensity during the night last night. She reports having had irregular but strong contractions, 4-6 x per hours, that took her breath away and kept her from sleeping. This morning around 6:45 AM Bethany Odonnell began to notice a persistent leakage of clear watery fluid from her vagina prompting her to come to the [facility]. Upon arrival she was determined to be ruptured with positive ferning. She endorses some blood streaking in her underwear with amniotic fluid, however denies frank bleeding. She also denies any recent history of headaches, blurry vision, no RUQ or epigastric pain. She endorses good fetal movement. Bethany Odonnell also endorses some recent allergic symptoms but denies any other illness or injury. See below for complete ROS.  Prenatal History/Complications:  Clinic  [place] Prenatal Labs  Dating  LMP c/w 9 week bedside u/s  Blood type: O/POS/-- (04/11 1629)   Genetic Screen  Quad: negative    Antibody:NEG (04/11 1629)Neg  Anatomic [place]  Normal. Repeat@ 32w for elevated hCG also WNL  Rubella: 2.69 (04/11 1629)Immune  GTT  Third trimester: 71  RPR: NON REAC (08/30 1645) NR  Flu vaccine  10/16  HBsAg: NEGATIVE (04/11 1629) Neg  TDaP vaccine  8/30                                           HIV: NONREACTIVE (08/30 1645) NR  GBS  negative      GBS: NEGATIVE  Contraception  None  Pap: Normal pap 09/26/14  Baby Food  Breast  GC/CT:  Circumcision  Yes    Pediatrician     Support Person  Bethany Odonnell    Past Medical History: Past Medical History  Diagnosis Date  . GERD (gastroesophageal reflux disease)   . Scoliosis   . IBS (irritable bowel syndrome)   . Anxiety    Past Surgical History: Past Surgical History   Procedure Laterality Date  . Induced abortion     Obstetrical History: OB History  Gravida Para Term Preterm AB SAB TAB Ectopic Multiple Living  3 1 1  1     1     # Outcome Date GA Lbr Len/2nd Weight Sex Delivery Anes PTL Lv  3 Current           2 AB           1 Term     F Vag-Spont   Y     Social History: Social History   Social History  . Marital Status: Single    Spouse Name: N/A  . Number of Children: N/A  . Years of Education: N/A   Social History Main Topics  . Smoking status: Never Smoker   . Smokeless tobacco: Never Used  . Alcohol Use: Yes     Comment: occasionally  . Drug Use: No  . Sexual Activity: Yes   Other Topics Concern  . None   Social History Narrative    Family History: Family History  Problem Relation Age of Onset  . Alcohol abuse Neg Hx   . Birth defects Mother   . Depression Mother   . Miscarriages / [place] Mother   .  Asthma Daughter   . Cancer Maternal Grandmother     Allergies: No Known Allergies  Facility-administered medications prior to admission  Medication Dose Route Frequency Provider Last Rate Last Dose  . Influenza vac split quadrivalent PF (FLUARIX) injection 0.5 mL  0.5 mL Intramuscular Once Emily Filbert, MD   0.5 mL at 02/14/15 1545   Prescriptions prior to admission  Medication Sig Dispense Refill Last Dose  . calcium carbonate (TUMS - DOSED IN MG ELEMENTAL CALCIUM) 500 MG chewable tablet Chew 2 tablets by mouth every 4 (four) hours as needed for heartburn (for stomach).   04/20/2015 at Unknown time  . ranitidine (ZANTAC) 150 MG tablet Take 1 tablet (150 mg total) by mouth 2 (two) times daily. 60 tablet 6 Past Week at Unknown time   Review of Systems  Review of Systems  Constitutional: Negative for fever and chills.  HENT: Positive for congestion. Negative for ear pain and sore throat.   Eyes: Negative for blurred vision and double vision.  Respiratory: Negative for cough, shortness of breath and wheezing.    Cardiovascular: Positive for palpitations. Negative for chest pain and leg swelling.  Gastrointestinal: Positive for heartburn. Negative for nausea, vomiting, abdominal pain, diarrhea and constipation.  Genitourinary: Negative for dysuria and hematuria.  Skin: Negative for rash.  Neurological: Negative for headaches.  Psychiatric/Behavioral: Negative for depression. The patient is nervous/anxious.    Filed Vitals:   04/20/15 0725 04/20/15 0833 04/20/15 0845  BP: 126/65 125/57   Pulse: 85 87   Temp: 98.6 F (37 C) 98.1 F (36.7 C)   TempSrc: Oral Oral   Resp: 18 18   Height:   5\' 7"  (1.702 m)  Weight:   185 lb (83.915 kg)   GEN: alert, comfortable-appearing woman resting in bed, family at the bedside PULM: CTAB CV: RRR, S1 and S2 heard, no M/R/G appreciated ABD: Gravid, NTTP. No epigastric of RUQ pain. No guarding. Fetus felt to be vertex by Leopold's. Confirmed by bedside u/s. GU: Cervix 3 / 80% / -2 as examined by J. Corinna Capra RN in ED EXTR: No LE edema or calf tenderness.  FHT: HR 135 / moderate variability / no accels / + early decel, no variables or lates Toco: irregular ctx q4-8 min  Prenatal Transfer Tool  Maternal Diabetes: No Genetic Screening: Normal Maternal Ultrasounds/Referrals: Normal Fetal Ultrasounds or other Referrals:  None Maternal Substance Abuse:  No Significant Maternal Medications:  None Significant Maternal Lab Results: None  Results for orders placed or performed during the hospital encounter of 04/20/15 (from the past 24 hour(s))  Fern Test   Collection Time: 04/20/15  7:58 AM  Result Value Ref Range   POCT Fern Test Positive = ruptured amniotic membanes   OB RESULT CONSOLE Group B Strep   Collection Time: 04/20/15  8:19 AM  Result Value Ref Range   GBS Negative     Patient Active Problem List   Diagnosis Date Noted  . Preterm PROM w/onset labor within 24 hours rupture in 3rd trimester 04/20/2015  . Anxiety during pregnancy in second  trimester, antepartum 12/22/2014  . Encounter for supervision of other normal pregnancy 09/26/2014   Assessment: [name] is a 29 y.o. G3P1011 at [redacted]w[redacted]d here for premature rupture of membranes at term. I would expect her to progress into active labor within 24 hours. -- Labor: Spontaneous labor, progressing normally -- Complicated by  -- Augmentation with Pitocin, Cytotec and Foley Balloon as appropriate -- Patient may have epidural at her  request -- GBS: negative -- Feeding: Bottle and Breast -- Contraception: undecided -- Circ: yes, outpatient  Diet: Clears PPx: None for now, low risk & ambulating Code Status: FULL Dispo: Admit to L&D for routine inpatient care of laboring patient.  Keane Scrape, MD 04/20/2015, 8:49 AM    CNM attestation:  I have seen and examined this patient; I agree with above documentation in the Resident's note.    Lamiyah Schlotter [place], CNM 6:24 AM

## 2015-04-20 NOTE — Anesthesia Preprocedure Evaluation (Signed)
Anesthesia Evaluation  Patient identified by MRN, date of birth, ID band Patient awake    Reviewed: Allergy & Precautions, Patient's Chart, lab work & pertinent test results  Airway Mallampati: II  TM Distance: >3 FB Neck ROM: Full    Dental no notable dental hx. (+) Teeth Intact   Pulmonary neg pulmonary ROS,    Pulmonary exam normal breath sounds clear to auscultation       Cardiovascular negative cardio ROS Normal cardiovascular exam Rhythm:Regular Rate:Normal     Neuro/Psych Anxiety negative neurological ROS     GI/Hepatic Neg liver ROS, GERD  Medicated and Controlled,IBS   Endo/Other  negative endocrine ROS  Renal/GU negative Renal ROS  negative genitourinary   Musculoskeletal negative musculoskeletal ROS (+) Scoliosis   Abdominal (+) - obese,   Peds  Hematology  (+) anemia ,   Anesthesia Other Findings   Reproductive/Obstetrics (+) Pregnancy                             Anesthesia Physical Anesthesia Plan  ASA: II  Anesthesia Plan: Epidural   Post-op Pain Management:    Induction:   Airway Management Planned: Natural Airway  Additional Equipment:   Intra-op Plan:   Post-operative Plan:   Informed Consent: I have reviewed the patients History and Physical, chart, labs and discussed the procedure including the risks, benefits and alternatives for the proposed anesthesia with the patient or authorized representative who has indicated his/her understanding and acceptance.     Plan Discussed with: Anesthesiologist  Anesthesia Plan Comments:         Anesthesia Quick Evaluation

## 2015-04-21 LAB — RPR: RPR Ser Ql: NONREACTIVE

## 2015-04-21 NOTE — Progress Notes (Signed)
Infant latched on right breast at last two feedings.  First feeding right breast only.  Second feeding started with attempted latch on left breast.  Latch achieved after attempts but no active sucking. Encourage to hold infant skin-to-skin to calm and re-attempt latch.  Mother latched infant to left breast for second feeding.  Reviewed importance of draining both breasts, milk production, and engorgement.  Discussed hand expression and set up hand pump.  Encouraged to latch to left breast first at next feeding; verbalized will call for assistance with latching if needed.

## 2015-04-21 NOTE — Lactation Note (Signed)
This note was copied from the chart of [place]. Lactation Consultation Note  Patient Name: Bethany Odonnell Date: 04/21/2015 Reason for consult: Follow-up assessment    With this mom of a term baby, now 56 hours old. Since mom was reporting painful latch, I examined the baby's mouth. He has an upper lip frenulum that extends to the gum line, and a lingual anterior thin/weebbed, tight frenulum, with a large amount of thick tissue behind it. I spoke to Dr. Tamera Punt about my findings, and she examined the baby's mouth. The parents decided to wait beforer having anything done at this time. Mom tried latching with 20 nipple shield, and colostrum was sen in the shield, but after a few minutes of the feeding, mom asked for formula ,and agreed to a DEP to provide EBM for the baby. I set up a DEP in premie setting for mom, with suction set very low (2 drops), as per mom's request, due to pain. Mom's nipples very tender, but do not appear to have any breakdown at this time. Mom instructed to hand express after each pumping, and to pump at least 8 times a day.  The baby is  Being offerr3d 10 ml's of formula on cue, until he is 58 hours old, and then I advised the parents to offer him up to 20 ml's of formula, on cue, at least every 3 hours. Mom to feed EBM prior to formula, when she expressed some.  Mom aware that lactation available to her and baby, evenafter discharge, and that she can make an o/p lactation appointment at her discharge to home, if desired.    Maternal Data Formula Feeding for Exclusion: No Has patient been taught Hand Expression?: Yes Does the patient have breastfeeding experience prior to this delivery?: Yes  Feeding Feeding Type: Breast Fed Length of feed: 3 min (too painful for mom, both without and with shield, due tio ttight, anterior tongue frenulum)  LATCH Score/Interventions Latch: Repeated attempts needed to sustain latch, nipple held in mouth throughout  feeding, stimulation needed to elicit sucking reflex. (with 20 nipple shield) Intervention(s): Skin to skin;Teach feeding cues;Waking techniques Intervention(s): Adjust position;Assist with latch;Breast compression  Audible Swallowing: A few with stimulation (colostrum seen in shield) Intervention(s): Skin to skin;Hand expression  Type of Nipple: Everted at rest and after stimulation (short shaft)  Comfort (Breast/Nipple): Filling, red/small blisters or bruises, mild/mod discomfort  Problem noted: Severe discomfort (with latch only)  Hold (Positioning): Assistance needed to correctly position infant at breast and maintain latch. Intervention(s): Breastfeeding basics reviewed;Support Pillows;Position options;Skin to skin  LATCH Score: 6  Lactation Tools Discussed/Used Tools: Nipple Shields Nipple shield size: 20 Pump Review: Setup, frequency, and cleaning;Milk Storage;Other (comment) (premie setting, hand expression) Initiated by:: Isabella Bowens, RN. IBCLC Date initiated:: 04/21/15   Consult Status Consult Status: Follow-up Date: 04/22/15 Follow-up type: In-patient    Tonna Corner 04/21/2015, 3:30 PM

## 2015-04-21 NOTE — Anesthesia Postprocedure Evaluation (Signed)
  Anesthesia Post-op Note  Patient: Bethany Odonnell  Procedure(s) Performed: * No procedures listed *  Patient Location: [facility] Unit  Anesthesia Type:Epidural  Level of Consciousness: awake, alert  and oriented  Airway and Oxygen Therapy: Patient Spontanous Breathing  Post-op Pain: none  Post-op Assessment: Post-op Vital signs reviewed, Patient's Cardiovascular Status Stable, Respiratory Function Stable, Patent Airway, No signs of Nausea or vomiting, Adequate PO intake, Pain level controlled, No headache, No backache and Patient able to bend at knees              Post-op Vital Signs: Reviewed and stable  Last Vitals:  Filed Vitals:   04/21/15 0250  BP: 121/56  Pulse: 75  Temp: 36.9 C  Resp: 18    Complications: No apparent anesthesia complications

## 2015-04-21 NOTE — Lactation Note (Signed)
This note was copied from the chart of [place]. Lactation Consultation Note  Patient Name: Bethany Odonnell [id number] Date: 04/21/2015 Reason for consult: Initial assessment    With this mom of a term baby, now 28 hours old. Mom has been latching the baby, but reports latch painful. Mom has short shaft, but evert nipples, large breasts and easily expressed colostrum. I told mom to call for lactation when baby cues, and I will assist mom with latching and assess baby's mouth. The baby has voided 3 times and passed 2 stols alrerady. Basic breast feeding teaching done from the baby and Me book, and lactation services reviewed with mom.    Maternal Data Formula Feeding for Exclusion: No Has patient been taught Hand Expression?: Yes Does the patient have breastfeeding experience prior to this delivery?: Yes  Feeding Feeding Type: Breast Fed Length of feed: 15 min  LATCH Score/Interventions Latch: Repeated attempts needed to sustain latch, nipple held in mouth throughout feeding, stimulation needed to elicit sucking reflex. Intervention(s): Skin to skin Intervention(s): Breast compression  Audible Swallowing: A few with stimulation Intervention(s): Skin to skin Intervention(s): Skin to skin  Type of Nipple: Everted at rest and after stimulation  Comfort (Breast/Nipple): Soft / non-tender     Hold (Positioning): No assistance needed to correctly position infant at breast. Intervention(s): Breastfeeding basics reviewed;Support Pillows  LATCH Score: 8  Lactation Tools Discussed/Used     Consult Status Consult Status: Follow-up Date: 04/21/15 Follow-up type: In-patient    Tonna Corner 04/21/2015, 11:50 AM

## 2015-04-21 NOTE — Progress Notes (Signed)
Bethany Odonnell is a 29 y.o. [id number] who is PPD # 1 s/p NSVD at 106w1d following an uncomplicated pregnancy course.  Subjective: Patient reports doing well this morning. Pain is controlled. Ambulating to bathroom without issues, reports normal voiding. Tolerating regular diet. Reports mild lochia with moderate bleeding, primary when breastfeeding.  Pertinent negatives on ROS include no blurry vision, no headache, no dyspnea, no chest pain, no epigastric or RUQ pain, no nausea or vomiting, no hematuria, no dysuria. Reports breast feeding is going well.  Objective: Filed Vitals:   04/20/15 2116 04/20/15 2155 04/20/15 2303 04/21/15 0250  BP: 116/65 132/62 119/72 121/56  Pulse: 84 67 94 75  Temp:  98.4 F (36.9 C) 98.9 F (37.2 C) 98.4 F (36.9 C)  Resp:  18 20 18   SpO2:  95% 99% 100%    GEN: Alert, comfortable-appearing woman resting in hospital bed, holding baby. Husband at bedside PULM: CTAB on frontal field exam CV: RRR, S1 and S2 heard, Grade I/VI systolic flow murmur at RUSB. No rubs or gallops noted. ABD: Fundus firm below the umbilicus. Abdomen appropriately TTP. No epigastric or RUQ pain. No guarding. GU: Lochia appropriate EXTR: No LE edema or calf tenderness.   Assessment / Plan: Bethany Odonnell is a 29 y.o. [phone] who is PPD #1 s/p NSVD at [redacted]w[redacted]d   -- Pain controlled on current regimen -- Hemodynamically stable -- Vitals signs stable and WNL -- Tolerating regular diet -- Ambulating and voiding without issue -- + BM last night -- Feeding method: breast -- Planned contraception method: still undecided, will give some handouts -- Circ: undecided, but would pursue as outpatient if they decide yes  Diet: Regular PPx: No DVT ppx at this time - patient is low risk and ambulating ad lib Code Status: FULL Dispo: Continue routine post-partum care x 24-48 hours following vaginal delivery. Anticipate d/c tomorrow if continuing to do well.  Keane Scrape, MD 04/21/2015  8:01 AM

## 2015-04-21 NOTE — Clinical Social Work Maternal (Signed)
CLINICAL SOCIAL WORK MATERNAL/CHILD NOTE  Patient Details  Name: Bethany Odonnell MRN: [id number] Date of Birth: [birth year]  Date:  04/21/2015  Clinical Social Worker Initiating Note:  Lucita Ferrara MSW, LCSW Date/ Time Initiated:  04/21/15/1220     Child's Name:  Ria Clock   Legal Guardian:  Julianne Rice and Lysbeth Galas  Need for Interpreter:  None   Date of Referral:  04/20/15     Reason for Referral:  History of anxiety  Referral Source:  [facility]   Address:  [address] [place],  [address]  Phone number:  [phone]   Household Members:  Minor Children, Spouse   Natural Supports (not living in the home):  Extended Family, Immediate Family, Friends   Chiropodist: None   Employment: Unemployed   Type of Work:   N/A  Education:    N/A  Pensions consultant:  Medicaid   Other Resources:  Physicist, medical , [place] Considerations Which May Impact Care:  None reported  Strengths:  Ability to meet basic needs , Home prepared for child    Risk Factors/Current Problems:   1)Mental Health Concerns: MOB presents with history of anxiety during the pregnancy. MOB shared belief that it was situational, and symptoms have since resolved.  More information included below.  Cognitive State:  Able to Concentrate , Alert , Linear Thinking , Goal Oriented    Mood/Affect:  Happy , Comfortable , Calm    CSW Assessment:  CSW received request for consult due to MOB presenting with symptoms of anxiety during the pregnancy.  MOB provided consent for the FOB and the FOB's sister to remain in the room during the assessment.  MOB displayed a full range in affect and presented in a pleasant mood. No symptoms of anxiety observed or noted in her thought process.    MOB expressed feelings of happiness and excitement secondary to the infant's birth.  She reported feeling happy and pleased with her childbirth experience despite a shift change  occuring right before the infant's birth.  MOB reported that she was nervous about the infant's health since she experienced prolonged period of pushing, but shard that she felt "better" when the infant displayed reassuring heart rates and was crying at birth.  MOB denied any concerns about her transition postpartum, and stated that she is looking forward to returning home.  MOB and FOB reported strong support system from both sides of their families. MOB and FOB shared that neither one of them is employed, and did not identify any financial concerns. MOB shared that she is enrolled in [facility] and that the family receives Physicist, medical.    Per chart review, MOB presents with history of anxiety since prior to 2013.  MOB endorsed increase in symptoms in February and during the first trimester of the pregnancy. 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  MOB stated that she was sicker than she anticipated during the first trimester which further complicated her mental health.  MOB reported that she quit her job, and once the morning sickness passed, she noted no further symptoms of anxiety.  MOB did not identify her anxiety as a current concern.  MOB denied history of postpartum depression after her 60 year old daughter was born.  CSW reviewed education and information on the [employer] and perinatal  mood disorder.  MOB and FOB denied questions or concerns related to the information, and agreed to follow up with MOB's medical provider if they note onset of symptoms.   MOB denied questions, concerns, or needs at this time. MOB and FOB expressed appreciation for the visit, acknowledged ongoing CSW availability, and agreed to contact CSW if needs arise during the admission.   CSW Plan/Description:   1)Patient/Family Education: Perinatal mood disorders 2)No Further  Intervention Required/No Barriers to Discharge    Sharyl Nimrod 04/21/2015, 12:52 PM

## 2015-04-22 MED ORDER — IBUPROFEN 600 MG PO TABS: 600.0000 mg | ORAL_TABLET | Freq: Four times a day (QID) | ORAL | Status: DC | PRN

## 2015-04-22 NOTE — Discharge Instructions (Signed)

## 2015-04-22 NOTE — Discharge Summary (Signed)
OB Discharge Summary     Patient Name: Bethany Odonnell DOB: [birth year] MRN: [id number]  Date of admission: 04/20/2015 Delivering MD: Christin Fudge   Date of discharge: 04/22/2015  Admitting diagnosis: WATER BROKE Intrauterine pregnancy: [redacted]w[redacted]d     Secondary diagnosis:  Principal Problem:   Preterm PROM w/onset labor within 24 hours rupture in 3rd trimester Active Problems:   Encounter for supervision of other normal pregnancy  Additional problems: none     Discharge diagnosis: Term Pregnancy Delivered                                                                                                Post partum procedures:none  Augmentation: none  Complications: None  Hospital course:  Onset of Labor With Vaginal Delivery     29 y.o. yo [id number] at [redacted]w[redacted]d was admitted in Latent Labor/SROM on 04/20/2015. Patient had an uncomplicated labor course as follows:  Membrane Rupture Time/Date: 6:50 AM ,04/20/2015   Intrapartum Procedures: Episiotomy: None [1]                                         Lacerations:  1st degree [2]  Patient had a delivery of a Viable infant. 04/20/2015  Information for the patient's newborn:  Bethany, Odonnell [[id number]]  Delivery Method: [place] had an uncomplicated postpartum course.  She is ambulating, tolerating a regular diet, passing flatus, and urinating well. Patient is discharged home in stable condition on 04/22/15.   Physical exam  Filed Vitals:   04/21/15 1045 04/21/15 1751 04/21/15 2232 04/22/15 0547  BP: 111/45 112/67 117/73 113/66  Pulse: 80 77 74 63  Temp: 98.7 F (37.1 C) 99 F (37.2 C) 98.5 F (36.9 C) 97.8 F (36.6 C)  TempSrc: Oral Oral Oral Oral  Resp: 18 18 17 18   Height:      Weight:      SpO2: 99% 100% 100% 100%   General: alert and cooperative Lochia: appropriate Uterine Fundus: firm Incision: N/A DVT Evaluation: No evidence of DVT seen on physical exam. Labs: Lab Results  Component Value  Date   WBC 12.3* 04/20/2015   HGB 9.6* 04/20/2015   HCT 30.3* 04/20/2015   MCV 86.6 04/20/2015   PLT 227 04/20/2015   CMP Latest Ref Rng 10/08/2014  Glucose 70 - 99 mg/dL 170(H)  BUN 6 - 23 mg/dL 6  Creatinine 0.50 - 1.10 mg/dL 0.47(L)  Sodium 135 - 145 mmol/L 133(L)  Potassium 3.5 - 5.1 mmol/L 3.2(L)  Chloride 96 - 112 mmol/L 104  CO2 19 - 32 mmol/L 22  Calcium 8.4 - 10.5 mg/dL 8.3(L)  Total Protein 6.0 - 8.3 g/dL 6.1  Total Bilirubin 0.3 - 1.2 mg/dL 0.7  Alkaline Phos 39 - 117 U/L 51  AST 0 - 37 U/L 12  ALT 0 - 35 U/L 8    Discharge instruction: per After Visit Summary and "Baby and Me Booklet".  Medications:MEDICATIONS: See list below After visit meds:  Medication List    STOP taking these medications        calcium carbonate 500 MG chewable tablet  Commonly known as:  TUMS - dosed in mg elemental calcium     ranitidine 150 MG tablet  Commonly known as:  ZANTAC      TAKE these medications        ibuprofen 600 MG tablet  Commonly known as:  ADVIL,MOTRIN  Take 1 tablet (600 mg total) by mouth every 6 (six) hours as needed.        Diet: routine diet  Activity: Advance as tolerated. Pelvic rest for 6 weeks.   Outpatient follow up:6 weeks Follow up Appt:No future appointments. Follow up Visit:No Follow-up on file.  Postpartum contraception: None  Newborn Data: Live born female  Birth Weight: 7 lb 8.1 oz (3405 g) APGAR: 9, 10  Baby Feeding: Breast/pumping due to 'tongue-tied' infant Disposition:home with mother   04/22/2015 Serita Grammes, CNM  04/22/2015

## 2015-04-22 NOTE — Progress Notes (Signed)
Discharge instructions reviewed with patient.  Patient states understanding of home care for herself and baby , medications, activity, signs/symptoms to report to MD and return MD office visit.  Patients significant other and family will assist with her care @ home.  No home  equipment needed, patient has prescriptions and all personal belongings.  Patient ambulated for discharge in stable condition with staff without incident.

## 2015-04-22 NOTE — Lactation Note (Signed)
This note was copied from the chart of [place]. Lactation Consultation Note  Patient Name: Bethany Odonnell LLVDI'X Date: 04/22/2015 Reason for consult: Follow-up assessment   Follow-up at 12 hrs old.  GA 39.1; BW 7 lbs, 8.1 oz;  Weight Loss only 3%.   Mom stated she has been bottle feeding infant and not latching since yesterday's visit from [facility].  Mom stated she has not pumped in past 24 hrs; Last pumping was with LC yesterday.   Mom stated she plans to get a [facility]BP from [facility] at her appointment on Thursday.  LC explained the need to pump 8 times per day for milk production and to maintain milk volume. Mom declined [place] and stated she would use hand pump at home.  LC encouraged mom to take all pump pieces with her when she is discharged.  Demonstrated how to use double hand pump from kit with tubing for use at home.  Mom also has single hand pump for discharge.   Mom stated she has follow-up with Peds on Monday and WIC on Thursday.   [place] spoke with RN about the need to increase feeding amounts based on day of life guidelines and continuing to increase as needed after discharge.  RN will include in her discharge teaching.      Consult Status Consult Status: Complete    Bethany Odonnell 04/22/2015, 11:49 AM

## 2015-04-22 NOTE — Plan of Care (Signed)
Problem: Fluid Volume: Goal: Ability to maintain a balanced intake and output will improve Outcome: Completed/Met Date Met:  04/22/15 Patient has adequate amount of intake as well as output.No peripheral lines in place0  Problem: Life Cycle: Goal: Risk for postpartum hemorrhage will decrease Outcome: Completed/Met Date Met:  04/22/15 Small amount of serous vaginal bleeding noted.What to call MD for with teach back done.

## 2015-04-26 ENCOUNTER — Encounter: Payer: Self-pay | Admitting: Obstetrics & Gynecology

## 2015-05-03 ENCOUNTER — Other Ambulatory Visit: Payer: BLUE CROSS/BLUE SHIELD

## 2015-05-03 DIAGNOSIS — R300 Dysuria: Secondary | ICD-10-CM

## 2015-05-03 NOTE — Progress Notes (Signed)
Pt here  2 weeks S/P SVD with c/o's urinary burning for 2 days.  Urine dip is unremarkable except for mod leukocyte.  Will send for urine culture.  Suggest patient use Uristat for the urinary discomfort until culture returns.Marland Kitchen

## 2015-05-04 LAB — CULTURE, URINE COMPREHENSIVE
COLONY COUNT: NO GROWTH
ORGANISM ID, BACTERIA: NO GROWTH

## 2015-05-05 ENCOUNTER — Telehealth: Payer: Self-pay | Admitting: *Deleted

## 2015-05-05 NOTE — Telephone Encounter (Signed)
Attempted to contact patient about her neg urine culture but was not available to leave a message on cell phone

## 2015-05-24 ENCOUNTER — Emergency Department (HOSPITAL_COMMUNITY)
Admission: EM | Admit: 2015-05-24 | Discharge: 2015-05-24 | Disposition: A | Discharge status: Home / Self Care | Payer: Medicaid Other | Attending: Emergency Medicine | Admitting: Emergency Medicine

## 2015-05-24 ENCOUNTER — Emergency Department (HOSPITAL_COMMUNITY): Payer: Medicaid Other

## 2015-05-24 ENCOUNTER — Encounter (HOSPITAL_COMMUNITY): Payer: Self-pay | Admitting: Emergency Medicine

## 2015-05-24 DIAGNOSIS — W19XXXA Unspecified fall, initial encounter: Secondary | ICD-10-CM

## 2015-05-24 DIAGNOSIS — T148XXA Other injury of unspecified body region, initial encounter: Secondary | ICD-10-CM

## 2015-05-24 DIAGNOSIS — S299XXA Unspecified injury of thorax, initial encounter: Secondary | ICD-10-CM | POA: Insufficient documentation

## 2015-05-24 DIAGNOSIS — Y9389 Activity, other specified: Secondary | ICD-10-CM | POA: Diagnosis not present

## 2015-05-24 DIAGNOSIS — T148 Other injury of unspecified body region: Secondary | ICD-10-CM | POA: Insufficient documentation

## 2015-05-24 DIAGNOSIS — Z8719 Personal history of other diseases of the digestive system: Secondary | ICD-10-CM | POA: Diagnosis not present

## 2015-05-24 DIAGNOSIS — Y998 Other external cause status: Secondary | ICD-10-CM | POA: Insufficient documentation

## 2015-05-24 DIAGNOSIS — M419 Scoliosis, unspecified: Secondary | ICD-10-CM | POA: Diagnosis not present

## 2015-05-24 DIAGNOSIS — Y9289 Other specified places as the place of occurrence of the external cause: Secondary | ICD-10-CM | POA: Diagnosis not present

## 2015-05-24 DIAGNOSIS — Z8659 Personal history of other mental and behavioral disorders: Secondary | ICD-10-CM | POA: Insufficient documentation

## 2015-05-24 DIAGNOSIS — W108XXA Fall (on) (from) other stairs and steps, initial encounter: Secondary | ICD-10-CM | POA: Insufficient documentation

## 2015-05-24 DIAGNOSIS — R0789 Other chest pain: Secondary | ICD-10-CM

## 2015-05-24 MED ORDER — IBUPROFEN 600 MG PO TABS: 600.0000 mg | ORAL_TABLET | Freq: Three times a day (TID) | ORAL | Status: DC

## 2015-05-24 NOTE — ED Provider Notes (Signed)
CSN: [id number]     Arrival date & time 05/24/15  0912 History   First MD Initiated Contact with Patient 05/24/15 1012     Chief Complaint  Patient presents with  . Fall  . Shoulder Pain  . Back Pain  . rib cage pain      (Consider location/radiation/quality/duration/timing/severity/associated sxs/prior Treatment) HPI Comments: She states that she feel down 2 stairs. No loc with fall. Now having pain in chest and back. Denies numbness, weakness or incontinence. No previous bony injury.  Patient is a 29 y.o. female presenting with fall. The history is provided by the patient. No language interpreter was used.  Fall This is a new problem. The current episode started today. The problem has been unchanged. Exacerbated by: movement. She has tried nothing for the symptoms.    Past Medical History  Diagnosis Date  . GERD (gastroesophageal reflux disease)   . Scoliosis   . IBS (irritable bowel syndrome)   . Anxiety   . Allergic rhinitis    Past Surgical History  Procedure Laterality Date  . Induced abortion     Family History  Problem Relation Age of Onset  . Alcohol abuse Father   . Birth defects Mother   . Depression Mother   . Miscarriages / [place] Mother   . Asthma Daughter   . Breast cancer Maternal Grandmother   . Lung cancer Maternal Grandfather   . Allergic rhinitis Daughter   . Drug abuse Father    Social History  Substance Use Topics  . Smoking status: Never Smoker   . Smokeless tobacco: Never Used  . Alcohol Use: Yes     Comment: occasionally on special occasion, however none while pregnant   OB History    Gravida Para Term Preterm AB TAB SAB Ectopic Multiple Living   3 2 2  1     0 2     Review of Systems  All other systems reviewed and are negative.     Allergies  Review of patient's allergies indicates no known allergies.  Home Medications   Prior to Admission medications   Medication Sig Start Date End Date Taking? Authorizing Provider    ibuprofen (ADVIL,MOTRIN) 600 MG tablet Take 1 tablet (600 mg total) by mouth every 6 (six) hours as needed. Patient taking differently: Take 600 mg by mouth every 6 (six) hours as needed for headache, mild pain or moderate pain.  04/22/15  Yes Myrtis Ser, CNM   BP 111/71 mmHg  Pulse 77  Temp(Src) 97.9 F (36.6 C) (Oral)  Resp 16  Ht 5\' 7"  (1.702 m)  Wt 74.844 kg  BMI 25.84 kg/m2  SpO2 100%  Breastfeeding? Yes Physical Exam  Constitutional: She is oriented to person, place, and time. She appears well-developed and well-nourished.  Cardiovascular: Normal rate and regular rhythm.   Pulmonary/Chest: Effort normal and breath sounds normal.  Right anterior upper chest tender to palpation  Abdominal: Soft. Bowel sounds are normal. There is no tenderness.  Musculoskeletal:       Cervical back: Normal.       Thoracic back: Normal.       Lumbar back: Normal.       Back:  Neurological: She is alert and oriented to person, place, and time. She exhibits abnormal muscle tone. Coordination normal.  Skin: Skin is warm and dry.  Nursing note and vitals reviewed.   ED Course  Procedures (including critical care time) Labs Review Labs Reviewed - No data to display  Imaging Review Dg Chest 2 View  05/24/2015  CLINICAL DATA:  Right-sided chest pain.  Recent fall. EXAM: CHEST  2 VIEW COMPARISON:  07/19/2014. FINDINGS: Mild scoliosis is stable, convex RIGHT. Normal cardiomediastinal silhouette. Clear lung fields. No visible acute bony abnormality. No effusion or pneumothorax. IMPRESSION: Stable chest.  No active disease. Electronically Signed   By: Staci Righter M.D.   On: 05/24/2015 10:34   I have personally reviewed and evaluated these images and lab results as part of my medical decision-making.   EKG Interpretation None      MDM   Final diagnoses:  Fall, initial encounter  Chest wall pain  Muscle strain    Neurologically intact. Likely strain. Pt is okay to follow up with pcp  as needed    Glendell Docker, NP 05/24/15 [place], MD 05/24/15 [phone]

## 2015-05-24 NOTE — Discharge Instructions (Signed)

## 2015-05-24 NOTE — ED Notes (Signed)
Pt states that this morning she was going down wooden steps and fell on the ice that she didn't see.  Pt sates that there was about 5 steps and she fell down two of them.  Pt c/o right shoulder, middle back and right side rib cage pain from the fall.  Pt denies hitting her head, LOC or taking blood thinners.

## 2015-05-26 ENCOUNTER — Encounter: Payer: Self-pay | Admitting: Advanced Practice Midwife

## 2015-05-26 ENCOUNTER — Ambulatory Visit (INDEPENDENT_AMBULATORY_CARE_PROVIDER_SITE_OTHER): Payer: Medicaid Other | Admitting: Advanced Practice Midwife

## 2015-05-26 VITALS — BP 114/59 | HR 78 | Resp 16 | Ht 67.0 in | Wt 170.0 lb

## 2015-05-26 DIAGNOSIS — Z975 Presence of (intrauterine) contraceptive device: Secondary | ICD-10-CM | POA: Insufficient documentation

## 2015-05-26 DIAGNOSIS — Z30430 Encounter for insertion of intrauterine contraceptive device: Secondary | ICD-10-CM

## 2015-05-26 DIAGNOSIS — Z3009 Encounter for other general counseling and advice on contraception: Secondary | ICD-10-CM

## 2015-05-26 DIAGNOSIS — N898 Other specified noninflammatory disorders of vagina: Secondary | ICD-10-CM

## 2015-05-26 DIAGNOSIS — Z309 Encounter for contraceptive management, unspecified: Secondary | ICD-10-CM

## 2015-05-26 DIAGNOSIS — Z01812 Encounter for preprocedural laboratory examination: Secondary | ICD-10-CM | POA: Diagnosis not present

## 2015-05-26 LAB — WET PREP BY MOLECULAR PROBE
CANDIDA SPECIES: NEGATIVE
GARDNERELLA VAGINALIS: POSITIVE — AB
Trichomonas vaginosis: NEGATIVE

## 2015-05-26 MED ORDER — LEVONORGESTREL 20 MCG/24HR IU IUD
1.0000 | INTRAUTERINE_SYSTEM | Freq: Once | INTRAUTERINE | Status: AC
  Administered 2015-05-26: 1 via INTRAUTERINE

## 2015-05-26 NOTE — Addendum Note (Signed)
Addended by: Fatima Blank A on: 05/26/2015 11:38 AM   Modules accepted: Level of Service

## 2015-05-26 NOTE — Progress Notes (Signed)
Patient ID: Bethany Odonnell, female   DOB: 17-Aug-1985, 29 y.o.   MRN: [id number] Post Partum Exam  Bethany Odonnell is a 29 y.o. [id number] female who presents for a postpartum visit. She is 5 weeks postpartum following a spontaneous vaginal delivery. I have fully reviewed the prenatal and intrapartum course. The delivery was at 42 gestational weeks.  Anesthesia: epidural. Postpartum course has been unremarkable Baby's course has been unremarkable except for being tongue tied.Bethany Odonnell is feeding by both breast and bottle - Carnation Good Start Soy. Bleeding no bleeding. Bowel function is normal. Bladder function is normal. Patient is not sexually active. Contraception method is none. Postpartum depression screening: negative.  The following portions of the patient's history were reviewed and updated as appropriate: allergies, current medications, past family history, past medical history, past social history, past surgical history and problem list.  Review of Systems A comprehensive review of systems was negative except for: report of white/yellow discharge, unsure if it is the last of her lochia   Objective:    BP 116/78 mmHg  Pulse 78  Resp 16  Ht 5\' 5"  (1.651 m)  Wt 211 lb (95.709 kg)  BMI 35.11 kg/m2  Breastfeeding? Yes  General:  alert, cooperative, appears stated age and no distress   Breasts:  inspection negative, no nipple discharge or bleeding, no masses or nodularity palpable  Lungs: clear to auscultation bilaterally  Heart:    Abdomen: soft, non-tender; bowel sounds normal; no masses,  no organomegaly   Vulva:  normal  Vagina: normal vagina and scant yellow/white discharge consistent with lochia alba, wet prep collected  Cervix:  multiparous appearance and no lesions  Corpus: not examined  Adnexa:  not evaluated  Rectal Exam: Not performed.        IUD Procedure Note Patient identified, informed consent performed.  Discussed risks of irregular bleeding, cramping, infection,  malpositioning or misplacement of the IUD outside the uterus which may require further procedures. Time out was performed.  Urine pregnancy test negative.  Speculum placed in the vagina.  Cervix visualized.  Cleaned with Betadine x 2.  Grasped anteriorly with a single tooth tenaculum.  Uterus sounded to 7 cm.  Mirena IUD placed per manufacturer's recommendations.  Strings trimmed to 3 cm. Tenaculum was removed, good hemostasis noted.  Patient tolerated procedure well.   Patient was given post-procedure instructions and the Mirena care card with expiration date.  Patient was also asked to check IUD strings periodically and follow up in 4-6 weeks for IUD check.   Assessment:   1. Encounter for IUD insertion --Appt in 4 weeks for string check  2. Vaginal discharge --Wet prep pending  3. Postpartum examination following vaginal delivery   4. Encounter for counseling regarding contraception --Discussed pt interest in [place], still breastfeeding with supply issues so do not recommend estrogen containing contraceptives at this time.    5.  Encounter for care of lactating mother --Discussed milk supply issues, pt reports she is happy with how things are going now with formula during the day and breastfeeding at night.  Discussed frequent latching/pumping to increase supply as needed.  Discussed tongue tie with pt. No revision performed at this time. Recommend f/u with lactation to evaluate tongue tie if she feels this is still affecting lactation.   Plan:    1. Contraception: IUD 2. Recommend f/u with lactation consultant as needed 3. Follow up in: 4 weeks for string check or as needed.

## 2015-05-29 ENCOUNTER — Telehealth: Payer: Self-pay | Admitting: *Deleted

## 2015-05-29 DIAGNOSIS — N760 Acute vaginitis: Principal | ICD-10-CM

## 2015-05-29 DIAGNOSIS — B9689 Other specified bacterial agents as the cause of diseases classified elsewhere: Secondary | ICD-10-CM

## 2015-05-29 MED ORDER — METRONIDAZOLE 500 MG PO TABS: 500.0000 mg | ORAL_TABLET | Freq: Two times a day (BID) | ORAL | Status: DC

## 2015-05-29 NOTE — Telephone Encounter (Signed)
Pt notified of positive BV and RX was sent to [place]

## 2015-06-02 ENCOUNTER — Ambulatory Visit: Payer: Self-pay | Admitting: Family

## 2015-06-28 ENCOUNTER — Ambulatory Visit: Payer: Self-pay | Admitting: Obstetrics & Gynecology

## 2016-01-05 ENCOUNTER — Inpatient Hospital Stay (HOSPITAL_COMMUNITY)
Admission: AD | Admit: 2016-01-05 | Discharge: 2016-01-06 | Disposition: A | Discharge status: Home / Self Care | Payer: Medicaid Other | Source: Ambulatory Visit | Attending: Obstetrics and Gynecology | Admitting: Obstetrics and Gynecology

## 2016-01-05 DIAGNOSIS — R110 Nausea: Secondary | ICD-10-CM | POA: Insufficient documentation

## 2016-01-05 DIAGNOSIS — F419 Anxiety disorder, unspecified: Secondary | ICD-10-CM | POA: Insufficient documentation

## 2016-01-05 DIAGNOSIS — B9689 Other specified bacterial agents as the cause of diseases classified elsewhere: Secondary | ICD-10-CM

## 2016-01-05 DIAGNOSIS — K219 Gastro-esophageal reflux disease without esophagitis: Secondary | ICD-10-CM | POA: Insufficient documentation

## 2016-01-05 DIAGNOSIS — K589 Irritable bowel syndrome without diarrhea: Secondary | ICD-10-CM | POA: Insufficient documentation

## 2016-01-05 DIAGNOSIS — M419 Scoliosis, unspecified: Secondary | ICD-10-CM | POA: Insufficient documentation

## 2016-01-05 DIAGNOSIS — N760 Acute vaginitis: Secondary | ICD-10-CM | POA: Insufficient documentation

## 2016-01-05 DIAGNOSIS — N926 Irregular menstruation, unspecified: Secondary | ICD-10-CM | POA: Insufficient documentation

## 2016-01-05 NOTE — MAU Note (Signed)
May have same virus husband and child had. Feel pains all over body and just does not feel well. Mostly came tonight due to pain in lower abd for couple days. I do have Bethany Odonnell IUD and had spotting Thurs night. Have had IUD for about 38mos. Some nausea. No diarrhea

## 2016-01-06 ENCOUNTER — Encounter (HOSPITAL_COMMUNITY): Payer: Self-pay | Admitting: *Deleted

## 2016-01-06 DIAGNOSIS — N760 Acute vaginitis: Secondary | ICD-10-CM

## 2016-01-06 DIAGNOSIS — A499 Bacterial infection, unspecified: Secondary | ICD-10-CM

## 2016-01-06 LAB — URINALYSIS, ROUTINE W REFLEX MICROSCOPIC
Bilirubin Urine: NEGATIVE
GLUCOSE, UA: NEGATIVE mg/dL
KETONES UR: NEGATIVE mg/dL
Leukocytes, UA: NEGATIVE
Nitrite: NEGATIVE
PROTEIN: NEGATIVE mg/dL
Specific Gravity, Urine: 1.02 (ref 1.005–1.030)
pH: 5.5 (ref 5.0–8.0)

## 2016-01-06 LAB — WET PREP, GENITAL
SPERM: NONE SEEN
Trich, Wet Prep: NONE SEEN
Yeast Wet Prep HPF POC: NONE SEEN

## 2016-01-06 LAB — URINE MICROSCOPIC-ADD ON: WBC UA: NONE SEEN WBC/hpf (ref 0–5)

## 2016-01-06 LAB — POCT PREGNANCY, URINE: Preg Test, Ur: NEGATIVE

## 2016-01-06 MED ORDER — ONDANSETRON 8 MG PO TBDP: 8.0000 mg | ORAL_TABLET | Freq: Three times a day (TID) | ORAL | Status: DC | PRN

## 2016-01-06 MED ORDER — METRONIDAZOLE 0.75 % VA GEL: 1.0000 | Freq: Every day | VAGINAL | Status: DC

## 2016-01-06 MED ORDER — ONDANSETRON 8 MG PO TBDP
8.0000 mg | ORAL_TABLET | Freq: Once | ORAL | Status: AC
  Administered 2016-01-06: 8 mg via ORAL
  Filled 2016-01-06: qty 1

## 2016-01-06 NOTE — Progress Notes (Signed)
Julie Wenzel PA in to discuss test results and d/c plan. Written and verbal d/c instructions given and understanding voiced 

## 2016-01-06 NOTE — MAU Provider Note (Signed)
History     CSN: [id number]  Arrival date and time: 01/05/16 2337   First Provider Initiated Contact with Patient 01/06/16 0011      Chief Complaint  Patient presents with  . Abdominal Pain   HPI [name] is a 30 y.o. [id number] who presents to MAU today with complaint of abdominal pain and nausea since last night. The patient denies vomiting or diarrhea, but does endorse mild constipation, although she had a BM today that was normal. She states diffuse abdominal cramping rated at 4/10. She has not taken anything for pain or nausea. She states difficulty swallowing pills. She denies fever, vaginal discharge or bleeding today. She states some bleeding earlier this week. Bleeding has been irregular and lasting 2-3 days at a time since IUD placed ~ 6 months ago.   OB History    Gravida Para Term Preterm AB TAB SAB Ectopic Multiple Living   3 2 2  1     0 2      Past Medical History  Diagnosis Date  . GERD (gastroesophageal reflux disease)   . Scoliosis   . IBS (irritable bowel syndrome)   . Anxiety   . Allergic rhinitis     Past Surgical History  Procedure Laterality Date  . Induced abortion      Family History  Problem Relation Age of Onset  . Alcohol abuse Father   . Birth defects Mother   . Depression Mother   . Miscarriages / [place] Mother   . Asthma Daughter   . Breast cancer Maternal Grandmother   . Lung cancer Maternal Grandfather   . Allergic rhinitis Daughter   . Drug abuse Father     Social History  Substance Use Topics  . Smoking status: Never Smoker   . Smokeless tobacco: Never Used  . Alcohol Use: Yes     Comment: occasionally on special occasion, however none while pregnant    Allergies: No Known Allergies  Prescriptions prior to admission  Medication Sig Dispense Refill Last Dose  . ibuprofen (ADVIL,MOTRIN) 600 MG tablet Take 1 tablet (600 mg total) by mouth every 6 (six) hours as needed. (Patient taking differently: Take 600 mg by  mouth every 6 (six) hours as needed for headache, mild pain or moderate pain. ) 50 tablet 0 Taking  . metroNIDAZOLE (FLAGYL) 500 MG tablet Take 1 tablet (500 mg total) by mouth 2 (two) times daily. 14 tablet 0     Review of Systems  Constitutional: Negative for fever and malaise/fatigue.  Gastrointestinal: Positive for nausea and abdominal pain. Negative for vomiting, diarrhea and constipation.  Genitourinary: Negative for dysuria, urgency and frequency.       + spotting Neg - vaginal discharge   Physical Exam   Blood pressure 104/64, pulse 85, temperature 98.2 F (36.8 C), resp. rate 18, height 5\' 7"  (1.702 m), weight 165 lb 3.2 oz (74.934 kg), not currently breastfeeding.  Physical Exam  Nursing note and vitals reviewed. Constitutional: She is oriented to person, place, and time. She appears well-developed and well-nourished. No distress.  HENT:  Head: Normocephalic and atraumatic.  Cardiovascular: Normal rate.   Respiratory: Effort normal.  GI: Soft. She exhibits no distension and no mass. There is no tenderness. There is no rebound and no guarding.  Genitourinary: Uterus is not enlarged and not tender. Cervix exhibits no motion tenderness, no discharge and no friability. Right adnexum displays no mass and no tenderness. Left adnexum displays no mass and no tenderness. There is bleeding (  scant) in the vagina. Vaginal discharge (scant light brown) found.  IUD strings are visualized and appear appropriate length  Neurological: She is alert and oriented to person, place, and time.  Skin: Skin is warm and dry. No erythema.  Psychiatric: She has a normal mood and affect.    Results for orders placed or performed during the hospital encounter of 01/05/16 (from the past 24 hour(s))  Urinalysis, Routine w reflex microscopic (not at [facility])     Status: Abnormal   Collection Time: 01/05/16 11:55 PM  Result Value Ref Range   Color, Urine YELLOW YELLOW   APPearance CLEAR CLEAR   Specific  Gravity, Urine 1.020 1.005 - 1.030   pH 5.5 5.0 - 8.0   Glucose, UA NEGATIVE NEGATIVE mg/dL   Hgb urine dipstick TRACE (A) NEGATIVE   Bilirubin Urine NEGATIVE NEGATIVE   Ketones, ur NEGATIVE NEGATIVE mg/dL   Protein, ur NEGATIVE NEGATIVE mg/dL   Nitrite NEGATIVE NEGATIVE   Leukocytes, UA NEGATIVE NEGATIVE  Urine microscopic-add on     Status: Abnormal   Collection Time: 01/05/16 11:55 PM  Result Value Ref Range   Squamous Epithelial / LPF 0-5 (A) NONE SEEN   WBC, UA NONE SEEN 0 - 5 WBC/hpf   RBC / HPF 0-5 0 - 5 RBC/hpf   Bacteria, UA RARE (A) NONE SEEN  Pregnancy, urine POC     Status: None   Collection Time: 01/06/16 12:02 AM  Result Value Ref Range   Preg Test, Ur NEGATIVE NEGATIVE  Wet prep, genital     Status: Abnormal   Collection Time: 01/06/16 12:20 AM  Result Value Ref Range   Yeast Wet Prep HPF POC NONE SEEN NONE SEEN   Trich, Wet Prep NONE SEEN NONE SEEN   Clue Cells Wet Prep HPF POC PRESENT (A) NONE SEEN   WBC, Wet Prep HPF POC MODERATE (A) NONE SEEN   Sperm NONE SEEN     MAU Course  Procedures None  MDM UPT - negative UA, wet prep, GC/Chlamydia today  ODT Zofran given for nausea  Assessment and Plan  A: Nausea without vomiting IUD check  Irregular bleeding with IUD Bacterial vaginosis  P: Discharge home Rx for Metrogel and Zofran given to patient  Warning signs for worsening condition discussed Patient advised to follow-up with [facility] if symptoms persist or worsen Patient may return to MAU as needed or if her condition were to change or worsen   Luvenia Redden, PA-C  01/06/2016, 12:49 AM

## 2016-01-06 NOTE — Discharge Instructions (Signed)

## 2016-01-08 LAB — GC/CHLAMYDIA PROBE AMP (~~LOC~~) NOT AT ARMC
Chlamydia: NEGATIVE
Neisseria Gonorrhea: NEGATIVE

## 2016-01-12 ENCOUNTER — Emergency Department (HOSPITAL_COMMUNITY): Payer: Medicaid Other

## 2016-01-12 ENCOUNTER — Emergency Department (HOSPITAL_COMMUNITY)
Admission: EM | Admit: 2016-01-12 | Discharge: 2016-01-12 | Disposition: A | Discharge status: Home / Self Care | Payer: Medicaid Other | Attending: Emergency Medicine | Admitting: Emergency Medicine

## 2016-01-12 ENCOUNTER — Encounter (HOSPITAL_COMMUNITY): Payer: Self-pay

## 2016-01-12 DIAGNOSIS — Z791 Long term (current) use of non-steroidal anti-inflammatories (NSAID): Secondary | ICD-10-CM | POA: Insufficient documentation

## 2016-01-12 DIAGNOSIS — R002 Palpitations: Secondary | ICD-10-CM | POA: Insufficient documentation

## 2016-01-12 LAB — URINALYSIS, ROUTINE W REFLEX MICROSCOPIC
BILIRUBIN URINE: NEGATIVE
GLUCOSE, UA: NEGATIVE mg/dL
HGB URINE DIPSTICK: NEGATIVE
KETONES UR: NEGATIVE mg/dL
Leukocytes, UA: NEGATIVE
Nitrite: NEGATIVE
PROTEIN: NEGATIVE mg/dL
Specific Gravity, Urine: 1.011 (ref 1.005–1.030)
pH: 6 (ref 5.0–8.0)

## 2016-01-12 LAB — BASIC METABOLIC PANEL
Anion gap: 8 (ref 5–15)
BUN: 9 mg/dL (ref 6–20)
CALCIUM: 9.2 mg/dL (ref 8.9–10.3)
CHLORIDE: 108 mmol/L (ref 101–111)
CO2: 23 mmol/L (ref 22–32)
CREATININE: 0.7 mg/dL (ref 0.44–1.00)
GFR calc Af Amer: >60 mL/min (ref >60)
GFR calc non Af Amer: >60 mL/min (ref >60)
Glucose, Bld: 98 mg/dL (ref 65–99)
Potassium: 3.8 mmol/L (ref 3.5–5.1)
SODIUM: 139 mmol/L (ref 135–145)

## 2016-01-12 LAB — CBG MONITORING, ED: Glucose-Capillary: 91 mg/dL (ref 65–99)

## 2016-01-12 LAB — CBC
HCT: 41.3 % (ref 36.0–46.0)
Hemoglobin: 13.8 g/dL (ref 12.0–15.0)
MCH: 31.1 pg (ref 26.0–34.0)
MCHC: 33.4 g/dL (ref 30.0–36.0)
MCV: 93 fL (ref 78.0–100.0)
Platelets: 206 10*3/uL (ref 150–400)
RBC: 4.44 MIL/uL (ref 3.87–5.11)
RDW: 12.5 % (ref 11.5–15.5)
WBC: 7 10*3/uL (ref 4.0–10.5)

## 2016-01-12 LAB — I-STAT BETA HCG BLOOD, ED (MC, WL, AP ONLY): I-stat hCG, quantitative: <5 m[IU]/mL (ref <5)

## 2016-01-12 MED ORDER — MECLIZINE HCL 25 MG PO TABS
25.0000 mg | ORAL_TABLET | Freq: Once | ORAL | Status: AC
  Administered 2016-01-12: 25 mg via ORAL
  Filled 2016-01-12: qty 1

## 2016-01-12 MED ORDER — MECLIZINE HCL 12.5 MG PO TABS: 12.5000 mg | ORAL_TABLET | Freq: Three times a day (TID) | ORAL | 0 refills | Status: DC | PRN

## 2016-01-12 NOTE — ED Notes (Signed)
PA at bedside.

## 2016-01-12 NOTE — ED Notes (Signed)
Pt maintained O2 saturation of 98-100% while ambulating

## 2016-01-12 NOTE — ED Provider Notes (Signed)
[facility] DEPT Provider Note   CSN: [id number] Arrival date & time: 01/12/16  [id number]  First Provider Contact:  First MD Initiated Contact with Patient 01/12/16 2040        History   Chief Complaint Chief Complaint  Patient presents with  . Shortness of Breath  . Palpitations  . Near Syncope    HPI Bethany Odonnell is a 30 y.o. female.  HPI  PT is a 30 y/o female with hx of anxiety, GERD, IBS, she presents to the ER with complaint of intermittent palpitations with associated SOB, lightheadedness, and feeling anxious.  She has experienced these symptoms for more than 8 months, she believes they began around the time she got mirena birth control.  They occur randomly, mostly when she is at rest, and episodes last a few seconds at a time.  Episodes have become more frequent, occurring daily.  Tonight at 5:30 pm she was laying down to take a nap and pt felt a flutter, then felt SOB, had to "take deep breaths to catch my breath" and had associated dizziness and felt like she was going to pass out.  This episode felt more intense than others, so she came to the ER to be evaluated. Palpitations spontaneously resolved after being placed in the exam room.  She reports occasional sensation of chest tightness.  The episodes feel somewhat like "her panic attacks."  Episodes are not related to exertion.  She denies CP, SOB, fever, cough, chills, LE edema, orthopnea, PND, syncope.  She denies hx of thyroid disease.  SHe denies any thyroid testing or holter monitoring.  She was prescribed medication in the past for anxiety but she never took it.  No other associated sx.  Pt reports being symptom free at the time of eval.  Past Medical History:  Diagnosis Date  . Allergic rhinitis   . Anxiety   . GERD (gastroesophageal reflux disease)   . IBS (irritable bowel syndrome)   . Scoliosis     Patient Active Problem List   Diagnosis Date Noted  . IUD (intrauterine device) in place 05/26/2015    Past  Surgical History:  Procedure Laterality Date  . INDUCED ABORTION      OB History    Gravida Para Term Preterm AB Living   3 2 2   1 2    SAB TAB Ectopic Multiple Live Births         0 2       Home Medications    Prior to Admission medications   Medication Sig Start Date End Date Taking? Authorizing Provider  ibuprofen (ADVIL,MOTRIN) 600 MG tablet Take 1 tablet (600 mg total) by mouth every 6 (six) hours as needed. Patient not taking: Reported on 01/12/2016 04/22/15   Myrtis Ser, CNM  metroNIDAZOLE (METROGEL VAGINAL) 0.75 % vaginal gel Place 1 Applicatorful vaginally at bedtime. Patient not taking: Reported on 01/12/2016 01/06/16   Luvenia Redden, PA-C  ondansetron (ZOFRAN ODT) 8 MG disintegrating tablet Take 1 tablet (8 mg total) by mouth every 8 (eight) hours as needed for nausea or vomiting. Patient not taking: Reported on 01/12/2016 01/06/16   Luvenia Redden, PA-C    Family History Family History  Problem Relation Age of Onset  . Alcohol abuse Father   . Drug abuse Father   . Birth defects Mother   . Depression Mother   . Miscarriages / [place] Mother   . Asthma Daughter   . Breast cancer Maternal Grandmother   . Lung  cancer Maternal Grandfather   . Allergic rhinitis Daughter     Social History Social History  Substance Use Topics  . Smoking status: Never Smoker  . Smokeless tobacco: Never Used  . Alcohol use Yes     Comment: occasionally on special occasion, however none while pregnant     Allergies   Review of patient's allergies indicates no known allergies.   Review of Systems Review of Systems  All other systems reviewed and are negative.    Physical Exam Updated Vital Signs BP 101/84 (BP Location: Right Arm)   Pulse 94   Temp 98.1 F (36.7 C) (Oral)   LMP 01/09/2016   SpO2 100%   Physical Exam  Constitutional: She is oriented to person, place, and time. She appears well-developed and well-nourished. No distress.  HENT:  Head:  Normocephalic and atraumatic.  Right Ear: External ear normal.  Left Ear: External ear normal.  Nose: Nose normal.  Mouth/Throat: Oropharynx is clear and moist. No oropharyngeal exudate.  Bilateral TM's normal  Eyes: Conjunctivae, EOM and lids are normal. Pupils are equal, round, and reactive to light. Right eye exhibits no discharge. Left eye exhibits no discharge. No scleral icterus. Right eye exhibits normal extraocular motion and no nystagmus. Left eye exhibits normal extraocular motion and no nystagmus.  Neck: Normal range of motion. Neck supple. No JVD present. No tracheal deviation present. No thyromegaly present.  Cardiovascular: Normal rate, regular rhythm, normal heart sounds and intact distal pulses.  Exam reveals no gallop and no friction rub.   No murmur heard. RRR, no M, G, R, no LE edema, no JVD  Pulmonary/Chest: Effort normal and breath sounds normal. No stridor. No respiratory distress. She has no wheezes. She has no rales. She exhibits no tenderness.  Abdominal: Soft. Bowel sounds are normal. She exhibits no distension and no mass. There is no tenderness. There is no rebound and no guarding.  Musculoskeletal: Normal range of motion. She exhibits no edema.  Lymphadenopathy:    She has no cervical adenopathy.  Neurological: She is alert and oriented to person, place, and time. She exhibits normal muscle tone. Coordination normal.  Skin: Skin is warm and dry. Capillary refill takes less than 2 seconds. No rash noted. She is not diaphoretic. No erythema. No pallor.  Psychiatric: She has a normal mood and affect. Her behavior is normal. Judgment and thought content normal.  Nursing note and vitals reviewed.    ED Treatments / Results  Labs (all labs ordered are listed, but only abnormal results are displayed) Labs Reviewed  BASIC METABOLIC PANEL  CBC  URINALYSIS, ROUTINE W REFLEX MICROSCOPIC (NOT AT [facility])  CBG MONITORING, ED  I-STAT BETA HCG BLOOD, ED ([facility], [facility], [facility] ONLY)     EKG  EKG Interpretation  Date/Time:  Friday January 12 2016 18:48:29 EDT Ventricular Rate:  80 PR Interval:    QRS Duration: 86 QT Interval:  354 QTC Calculation: 409 R Axis:   76 Text Interpretation:  Sinus rhythm Borderline T wave abnormalities Confirmed by Jeneen Rinks  MD, [place] ([id number]) on 01/12/2016 7:28:09 PM       Radiology Dg Chest 2 View  Result Date: 01/12/2016 CLINICAL DATA:  Pt c/o intermittent SOB, palpitations, and dizziness/near-syncope for over 8 months. Denies pain. Pt reports that she was diagnosed w/ anxiety prior to becoming pregnant, but she did not take the prescribed medication. Denies precipitating factors. Pt reports that symptoms increased after having IUD placed around 6 months ago. Hx non-smoker. EXAM: CHEST  2  VIEW COMPARISON:  All/12/2014 FINDINGS: Heart, mediastinum hila are unremarkable. Lungs are clear.  No pleural effusion or pneumothorax. Mild dextroscoliosis of the thoracic spine, stable. IMPRESSION: No active cardiopulmonary disease. Electronically Signed   By: Lajean Manes M.D.   On: 01/12/2016 19:14   Procedures Procedures (including critical care time)  Medications Ordered in ED Medications - No data to display   Initial Impression / Assessment and Plan / ED Course  I have reviewed the triage vital signs and the nursing notes.  Pertinent labs & imaging results that were available during my care of the patient were reviewed by me and considered in my medical decision making (see chart for details).  Clinical Course    Pt with intermittent palpitations with associated SOB and near-syncope and dizziness, sx for 8 months+ CXR negative, no cardiomegaly.  EKG is normal sinus rhythm.  Labs are unremarkable, pt hemodynamically stable, appears euvolemic.  She is able to ambulate with/out recurrence of sx, normal HR and SpO2.  Suspect pt may be having sx from anxiety, but encouraged her to see her PCP to r/o other etiologies with holter monitoring,  thyroid testing, etc. Pt is well appearing, stable to discharge home and follow up with PCP.  She described some spinning sensation occasionally, will give meclizine trial.  Pt tolerated dose in the ER.  Final Clinical Impressions(s) / ED Diagnoses   Final diagnoses:  Palpitations    New Prescriptions Discharge Medication List as of 01/12/2016  9:57 PM    START taking these medications   Details  meclizine (ANTIVERT) 12.5 MG tablet Take 1 tablet (12.5 mg total) by mouth 3 (three) times daily as needed for dizziness., Starting Fri 01/12/2016, Print         Delsa Grana, PA-C 01/18/16 [place], DO 01/29/16 1544

## 2016-01-12 NOTE — Progress Notes (Signed)
Patient listed as having Medicaid insurance without a pcp.  EDCM went to speak to patient at bedside.  Patient reports she used to have Medicaid, but now it's not active.  Patient reports she has dental and vision through her job but not [place] provided patient with contact information to the DSS to inquire about her Medicaid status.  EDCM also provided the following:   [facility] provided patient with pamphlet to [facility], informed patient of services there and walk in times.  EDCM also provided patient with list of pcps who accept self pay patients, list of discount pharmacies and websites needymeds.org and GoodRX.com for medication assistance, phone number to inquire about the orange card, phone number to inquire about Medicaid, phone number to inquire about the [place], financial resources in the community such as local churches, [employer], urban ministries, and dental assistance for uninsured patients.  Patient thankful for resources.  No further EDCM needs at this time.

## 2016-01-12 NOTE — ED Triage Notes (Addendum)
Pt c/o intermittent SOB, palpitations, and dizziness/near-syncope for over 8 months.  Denies pain.  Pt reports that she was diagnosed w/ anxiety prior to becoming pregnant, but she did not take the prescribed medication.  Denies precipitating factors.       Pt reports that symptoms increased after having IUD placed around 6 months ago.

## 2016-01-22 ENCOUNTER — Ambulatory Visit (INDEPENDENT_AMBULATORY_CARE_PROVIDER_SITE_OTHER): Payer: Self-pay | Admitting: Obstetrics & Gynecology

## 2016-01-22 ENCOUNTER — Encounter: Payer: Self-pay | Admitting: Obstetrics & Gynecology

## 2016-01-22 VITALS — BP 117/74 | HR 79 | Wt 162.0 lb

## 2016-01-22 DIAGNOSIS — N949 Unspecified condition associated with female genital organs and menstrual cycle: Secondary | ICD-10-CM

## 2016-01-22 DIAGNOSIS — B9689 Other specified bacterial agents as the cause of diseases classified elsewhere: Secondary | ICD-10-CM

## 2016-01-22 DIAGNOSIS — Z30432 Encounter for removal of intrauterine contraceptive device: Secondary | ICD-10-CM

## 2016-01-22 DIAGNOSIS — N760 Acute vaginitis: Secondary | ICD-10-CM

## 2016-01-22 DIAGNOSIS — A499 Bacterial infection, unspecified: Secondary | ICD-10-CM

## 2016-01-22 DIAGNOSIS — R102 Pelvic and perineal pain: Secondary | ICD-10-CM

## 2016-01-22 MED ORDER — METRONIDAZOLE 500 MG PO TABS: 500.0000 mg | ORAL_TABLET | Freq: Two times a day (BID) | ORAL | 0 refills | Status: DC

## 2016-01-22 NOTE — Progress Notes (Signed)
   Subjective:    Patient ID: Bethany Odonnell, female    DOB: March 25, 1986, 30 y.o.   MRN: [id number]  HPI  30 yo AA lady with a 6 month old baby would like her Mirena removed due to "cramping and hormone imbalance". She plans to use condoms prn for contraception.  Review of Systems     Objective:   Physical Exam [id number] Breathing, conversing, and ambulating normally Intact Mirena IUD removed easily She tolerated the procedure well.       Assessment & Plan:  Rec MVI daily RTC for annual/prn sooner

## 2016-01-22 NOTE — Addendum Note (Signed)
Addended by: Asencion Islam on: 01/22/2016 04:34 PM   Modules accepted: Orders

## 2016-02-12 IMAGING — CR DG CHEST 2V
2 series · 2 of 2 positions shown · non-contrast
Comparison: PA and lateral chest x-ray July 07, 2010

CLINICAL DATA: Shortness of breath, anxiety, palpitations

EXAM:
CHEST  2 VIEW

[chest pa]
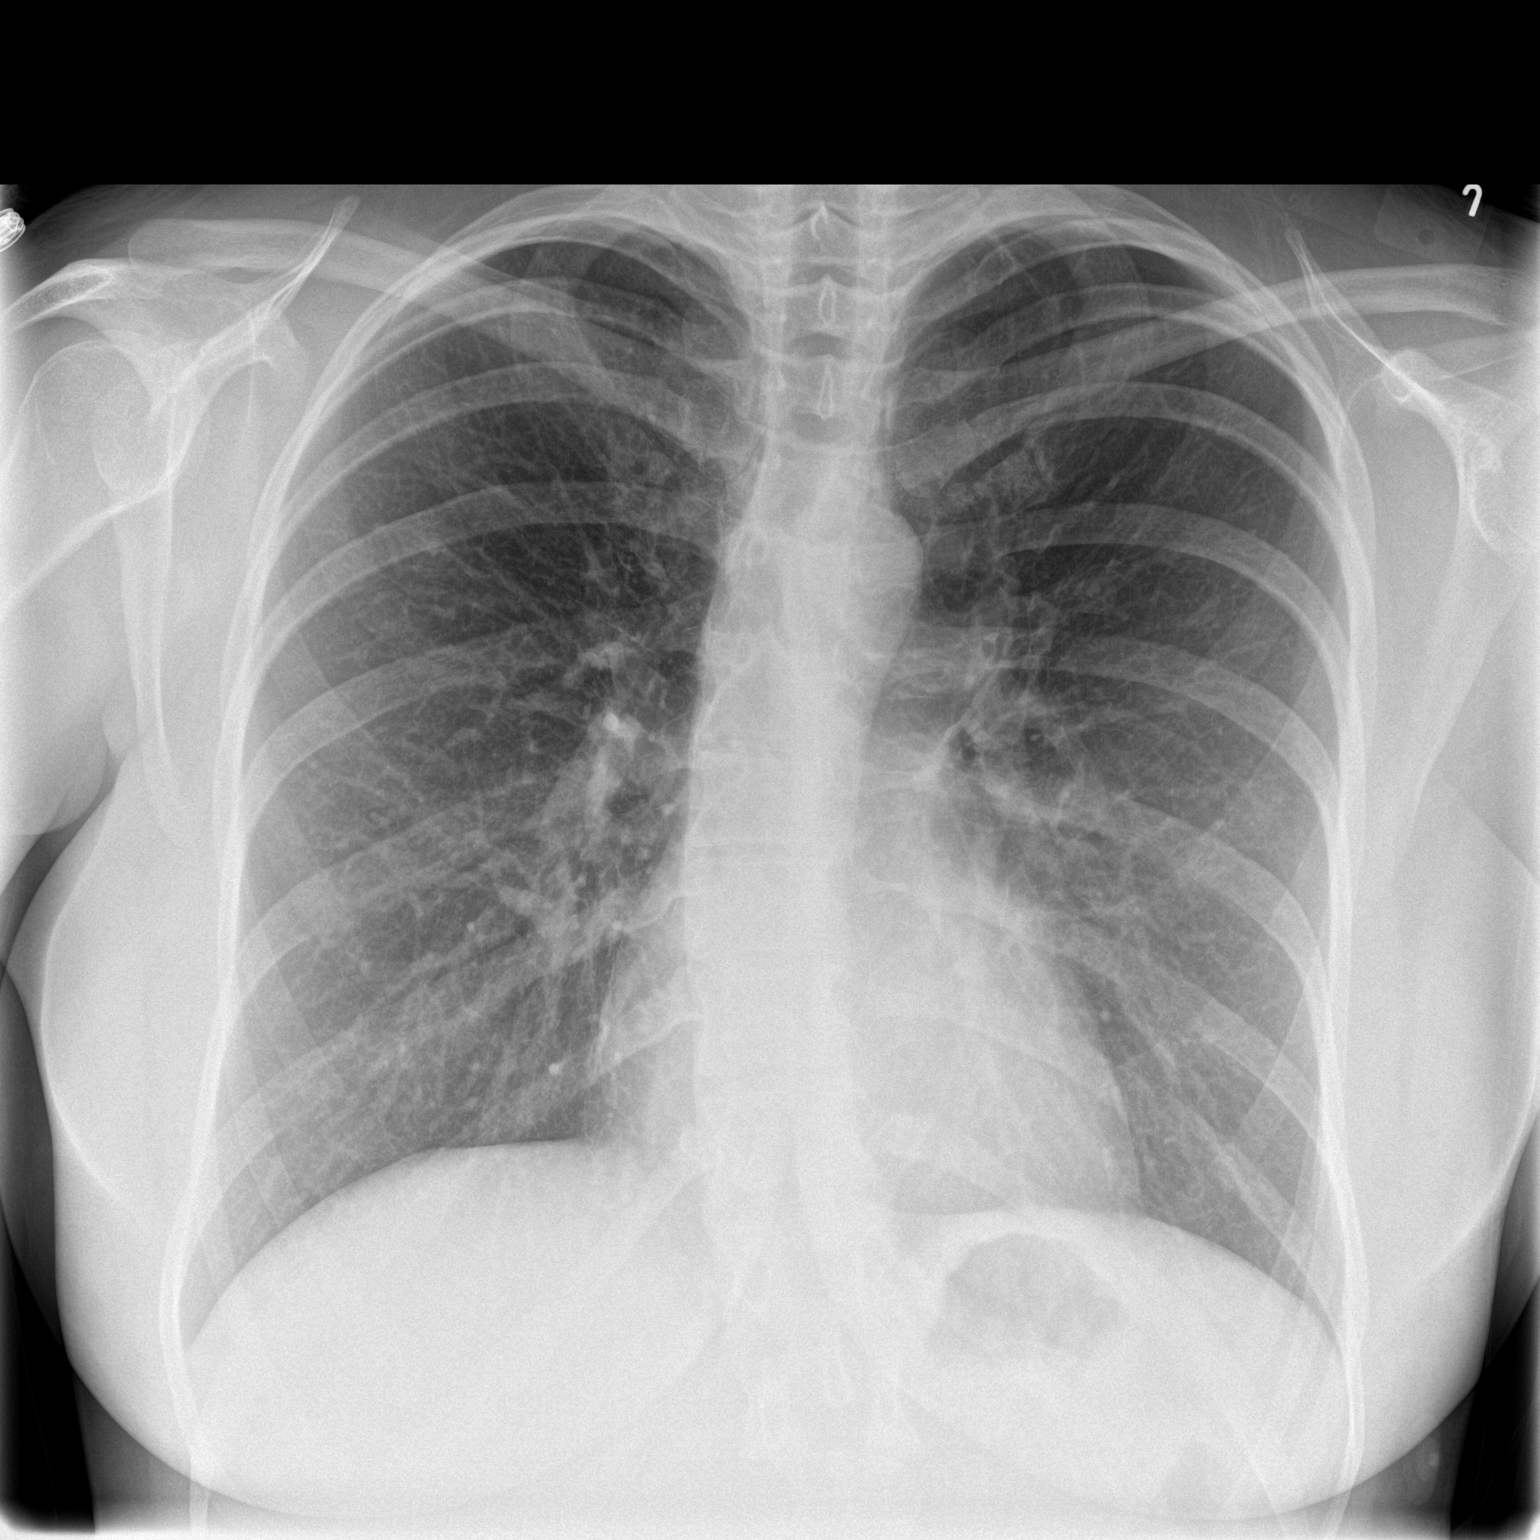

[chest lat]
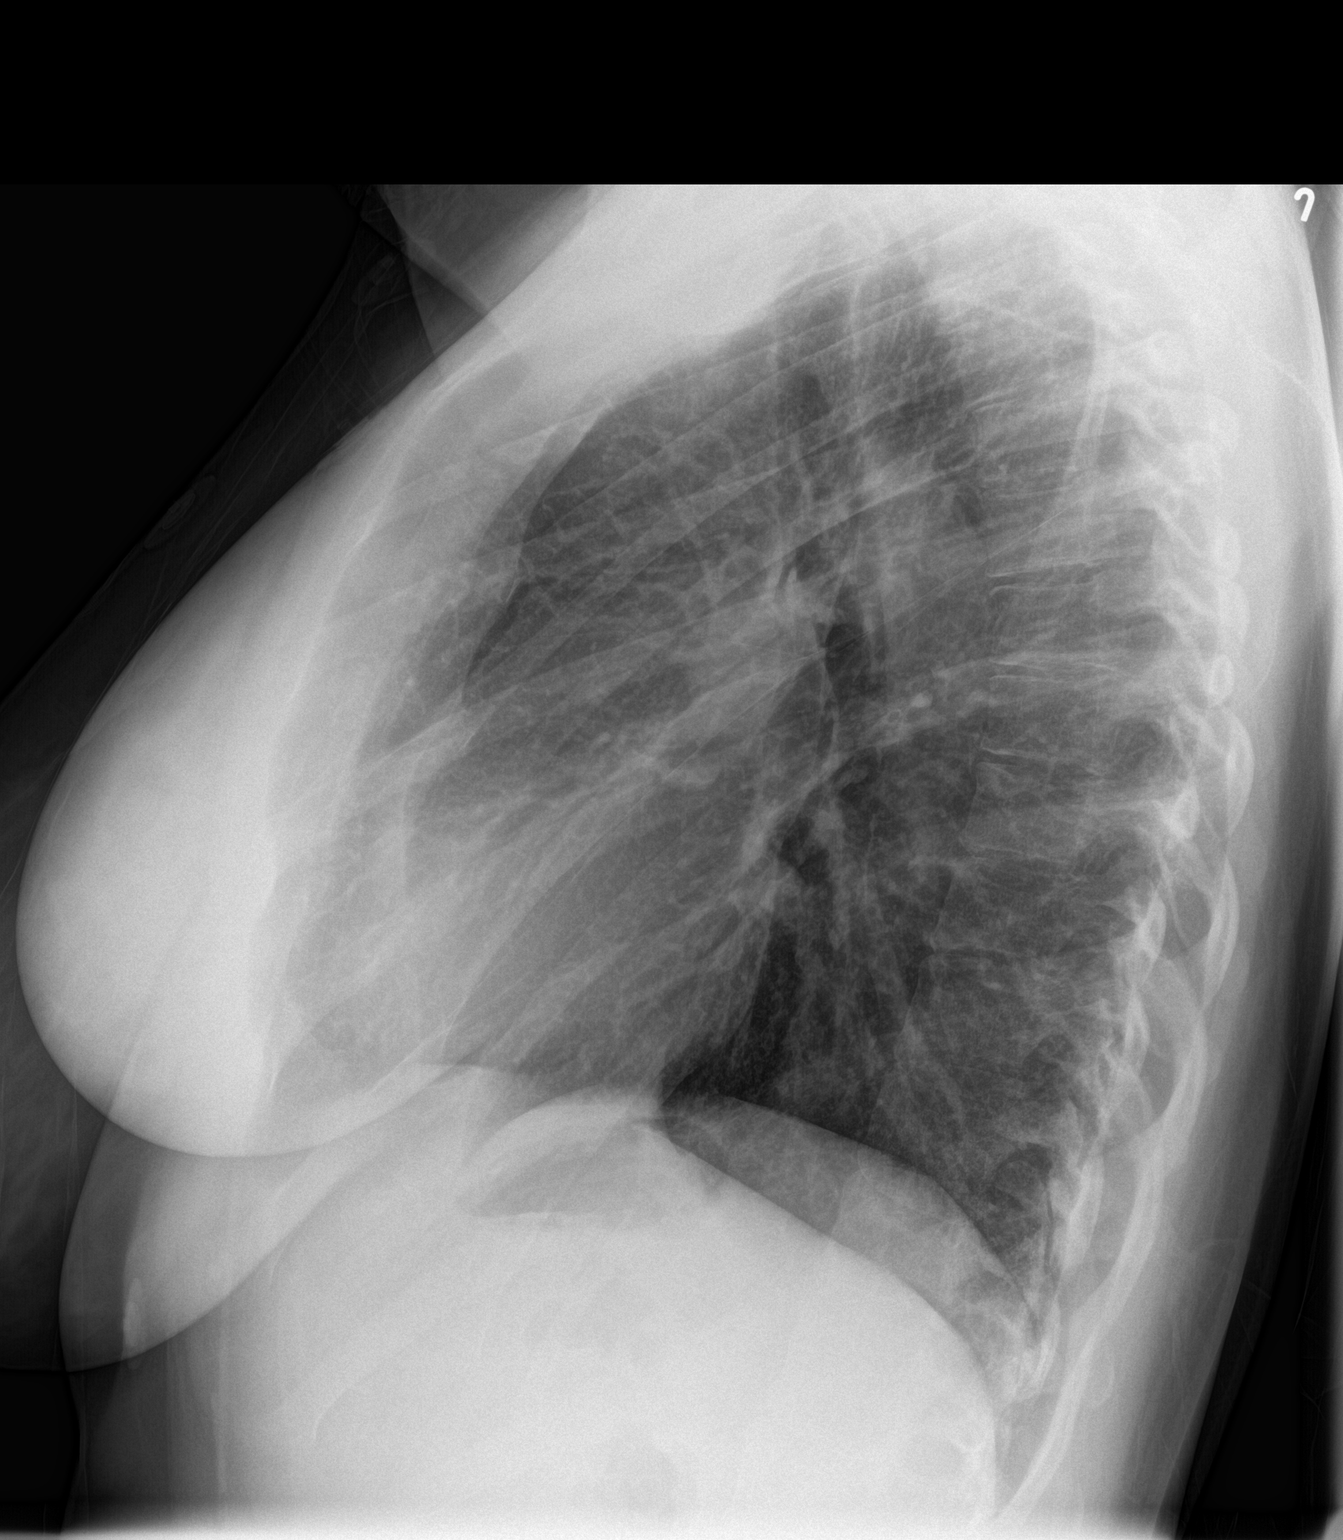

[2 of 2 positions shown; findings below may reference images not displayed]

FINDINGS: The lungs are well-expanded. There is no focal infiltrate. There is
no pleural effusion or pneumothorax. The heart and pulmonary
vascular at merit year normal. The trachea is midline. There is
stable mild dextro curvature of the mid thoracic spine.
IMPRESSION: There is no active cardiopulmonary disease.

## 2016-04-04 ENCOUNTER — Emergency Department (HOSPITAL_COMMUNITY): Payer: Self-pay

## 2016-04-04 ENCOUNTER — Encounter (HOSPITAL_COMMUNITY): Payer: Self-pay | Admitting: *Deleted

## 2016-04-04 ENCOUNTER — Emergency Department (HOSPITAL_COMMUNITY)
Admission: EM | Admit: 2016-04-04 | Discharge: 2016-04-04 | Disposition: A | Discharge status: Home / Self Care | Payer: Self-pay | Attending: Emergency Medicine | Admitting: Emergency Medicine

## 2016-04-04 DIAGNOSIS — J069 Acute upper respiratory infection, unspecified: Secondary | ICD-10-CM | POA: Insufficient documentation

## 2016-04-04 DIAGNOSIS — B9789 Other viral agents as the cause of diseases classified elsewhere: Secondary | ICD-10-CM

## 2016-04-04 LAB — POC URINE PREG, ED: Preg Test, Ur: NEGATIVE

## 2016-04-04 LAB — RAPID STREP SCREEN (MED CTR MEBANE ONLY): Streptococcus, Group A Screen (Direct): NEGATIVE

## 2016-04-04 MED ORDER — SALINE SPRAY 0.65 % NA SOLN: 1.0000 | NASAL | 0 refills | Status: DC | PRN

## 2016-04-04 MED ORDER — IBUPROFEN 200 MG PO TABS
600.0000 mg | ORAL_TABLET | Freq: Once | ORAL | Status: AC
  Administered 2016-04-04: 600 mg via ORAL
  Filled 2016-04-04: qty 1

## 2016-04-04 MED ORDER — PSEUDOEPHEDRINE HCL 30 MG PO TABS: 30.0000 mg | ORAL_TABLET | ORAL | 0 refills | Status: DC | PRN

## 2016-04-04 MED ORDER — BENZONATATE 100 MG PO CAPS: 100.0000 mg | ORAL_CAPSULE | Freq: Three times a day (TID) | ORAL | 0 refills | Status: DC

## 2016-04-04 NOTE — ED Provider Notes (Signed)
[facility] DEPT Provider Note   CSN: [id number] Arrival date & time: 04/04/16  1641 By signing my name below, I, Bethany Odonnell, attest that this documentation has been prepared under the direction and in the presence of [facility], [place]. Electronically Signed: Georgette Odonnell, ED Scribe. 04/04/16. 5:58 PM.  History   Chief Complaint Chief Complaint  Patient presents with  . URI  . Headache   HPI Comments: Bethany Odonnell is a 30 y.o. female with no pertinent past medical history, who presents to the Emergency Department complaining of aching, 8/10 headache onset 4 days ago. She also has associated neck pain, dizziness, sore throat, ear pain, congestion, chills, productive cough, and rhinorrhea. Pt has tried Tylenol with no relief to her symptoms. No known sick contacts with similar symptoms. She denies fever, nausea, vomiting, urinary frequency, dysuria, or any other associated symptoms.   The history is provided by the patient. No language interpreter was used.  URI   This is a new problem. The current episode started more than 2 days ago. The problem has not changed since onset.There has been no fever. Associated symptoms include congestion, ear pain, headaches, rhinorrhea, sore throat, neck pain and cough. Pertinent negatives include no chest pain, no abdominal pain, no diarrhea, no nausea, no vomiting, no dysuria and no rash. She has tried other medications for the symptoms. The treatment provided no relief.    Past Medical History:  Diagnosis Date  . Allergic rhinitis   . Anxiety   . GERD (gastroesophageal reflux disease)   . IBS (irritable bowel syndrome)   . Scoliosis     Patient Active Problem List   Diagnosis Date Noted  . IUD (intrauterine device) in place 05/26/2015    Past Surgical History:  Procedure Laterality Date  . INDUCED ABORTION      OB History    Gravida Para Term Preterm AB Living   3 2 2   1 2    SAB TAB Ectopic Multiple Live Births         0 2        Home Medications    Prior to Admission medications   Medication Sig Start Date End Date Taking? Authorizing Provider  benzonatate (TESSALON) 100 MG capsule Take 1 capsule (100 mg total) by mouth every 8 (eight) hours. 04/04/16   Hope Bunnie Pion, NP  ibuprofen (ADVIL,MOTRIN) 600 MG tablet Take 1 tablet (600 mg total) by mouth every 6 (six) hours as needed. Patient not taking: Reported on 01/12/2016 04/22/15   Myrtis Ser, CNM  meclizine (ANTIVERT) 12.5 MG tablet Take 1 tablet (12.5 mg total) by mouth 3 (three) times daily as needed for dizziness. 01/12/16   Delsa Grana, PA-C  metroNIDAZOLE (FLAGYL) 500 MG tablet Take 1 tablet (500 mg total) by mouth 2 (two) times daily. 01/22/16   Emily Filbert, MD  metroNIDAZOLE (METROGEL VAGINAL) 0.75 % vaginal gel Place 1 Applicatorful vaginally at bedtime. Patient not taking: Reported on 01/12/2016 01/06/16   Luvenia Redden, PA-C  ondansetron (ZOFRAN ODT) 8 MG disintegrating tablet Take 1 tablet (8 mg total) by mouth every 8 (eight) hours as needed for nausea or vomiting. Patient not taking: Reported on 01/12/2016 01/06/16   Luvenia Redden, PA-C  pseudoephedrine (SUDAFED) 30 MG tablet Take 1 tablet (30 mg total) by mouth every 4 (four) hours as needed for congestion. 04/04/16   Hope Bunnie Pion, NP  sodium chloride (OCEAN) 0.65 % SOLN nasal spray Place 1 spray into both nostrils as needed  for congestion. 04/04/16   Hope Bunnie Pion, NP    Family History Family History  Problem Relation Age of Onset  . Alcohol abuse Father   . Drug abuse Father   . Birth defects Mother   . Depression Mother   . Miscarriages / [place] Mother   . Asthma Daughter   . Breast cancer Maternal Grandmother   . Lung cancer Maternal Grandfather   . Allergic rhinitis Daughter     Social History Social History  Substance Use Topics  . Smoking status: Never Smoker  . Smokeless tobacco: Never Used  . Alcohol use Yes     Comment: occasionally on special occasion, however none while  pregnant     Allergies   Review of patient's allergies indicates no known allergies.   Review of Systems Review of Systems  Constitutional: Positive for chills. Negative for fever.  HENT: Positive for congestion, ear pain, rhinorrhea and sore throat. Negative for trouble swallowing.   Eyes: Negative for visual disturbance.  Respiratory: Positive for cough. Negative for shortness of breath.   Cardiovascular: Negative for chest pain.  Gastrointestinal: Negative for abdominal pain, diarrhea, nausea and vomiting.  Genitourinary: Negative for dysuria and frequency.  Musculoskeletal: Positive for neck pain. Negative for neck stiffness.  Skin: Negative for rash.  Neurological: Positive for headaches. Negative for syncope.  Psychiatric/Behavioral: Negative for confusion. The patient is not nervous/anxious.      Physical Exam Updated Vital Signs BP 100/57 (BP Location: Left Arm)   Pulse 86   Temp 98.3 F (36.8 C) (Oral)   Resp 16   Ht 5\' 7"  (1.702 m)   Wt 71.8 kg   LMP 03/28/2016   SpO2 99%   BMI 24.81 kg/m   Physical Exam  Constitutional: She appears well-developed and well-nourished.  HENT:  Head: Normocephalic.  Right Ear: Tympanic membrane normal.  Left Ear: Tympanic membrane normal.  Mouth/Throat: Uvula is midline and mucous membranes are normal. Posterior oropharyngeal erythema present. No posterior oropharyngeal edema or tonsillar abscesses.  Uvula midline. Erythema present. Tonsils enlarged but No tonsillar abscess.  Eyes: Conjunctivae and EOM are normal.  Neck: Normal range of motion. Neck supple.  No meningeal signs.  Cardiovascular: Normal rate and regular rhythm.   Pulmonary/Chest: Effort normal and breath sounds normal. No respiratory distress.  Abdominal: Soft. There is no tenderness.  Musculoskeletal: Normal range of motion.  Lymphadenopathy:    She has no cervical adenopathy.  Neurological: She is alert.  Skin: Skin is warm and dry.  Psychiatric: She  has a normal mood and affect. Her behavior is normal.  Nursing note and vitals reviewed.    ED Treatments / Results  DIAGNOSTIC STUDIES: Oxygen Saturation is 100% on RA, normal by my interpretation.    COORDINATION OF CARE: 5:58 PM Discussed treatment plan with pt at bedside which includes strep test and pt agreed to plan.  Labs (all labs ordered are listed, but only abnormal results are displayed) Labs Reviewed  RAPID STREP SCREEN (NOT AT [facility])  CULTURE, GROUP A STREP [facility])  POC URINE PREG, ED   Radiology Dg Chest 2 View  Result Date: 04/04/2016 CLINICAL DATA:  Productive cough and head pressure for 3 days. EXAM: CHEST  2 VIEW COMPARISON:  Chest radiograph January 12, 2016 FINDINGS: Cardiomediastinal silhouette is normal. No pleural effusions or focal consolidations. Trachea projects midline and there is no pneumothorax. Soft tissue planes and included osseous structures are non-suspicious. Mild broad lower thoracic dextroscoliosis. IMPRESSION: Negative chest radiograph. Electronically Signed  By: Elon Alas M.D.   On: 04/04/2016 20:47    Procedures Procedures (including critical care time)  Medications Ordered in ED Medications  ibuprofen (ADVIL,MOTRIN) tablet 600 mg (600 mg Oral Given 04/04/16 1811)     Initial Impression / Assessment and Plan / ED Course  I have reviewed the triage vital signs and the nursing notes.  Pertinent labs & imaging results that were available during my care of the patient were reviewed by me and considered in my medical decision making (see chart for details).  Clinical Course   Final Clinical Impressions(s) / ED Diagnoses   Final diagnoses:  Viral URI with cough   Pt symptoms consistent with URI. Pt will be discharged with symptomatic treatment.  Discussed return precautions.  Pt is hemodynamically stable & in NAD prior to discharge.  New Prescriptions Discharge Medication List as of 04/04/2016  9:05 PM    START taking these  medications   Details  benzonatate (TESSALON) 100 MG capsule Take 1 capsule (100 mg total) by mouth every 8 (eight) hours., Starting Thu 04/04/2016, Print    pseudoephedrine (SUDAFED) 30 MG tablet Take 1 tablet (30 mg total) by mouth every 4 (four) hours as needed for congestion., Starting Thu 04/04/2016, Print    sodium chloride (OCEAN) 0.65 % SOLN nasal spray Place 1 spray into both nostrils as needed for congestion., Starting Thu 04/04/2016, Print       I personally performed the services described in this documentation, which was scribed in my presence. The recorded information has been reviewed and is accurate.     [address] [place], NP 04/05/16 [id number]    Ezequiel Essex, MD 04/05/16 [phone]

## 2016-04-04 NOTE — ED Triage Notes (Signed)
Pt c/o cough, nasal congestion, runny nose, headache since Monday. Pt denies fever at home. Pt taking OTC medications without relief.

## 2016-04-07 LAB — CULTURE, GROUP A STREP (THRC)

## 2016-08-21 ENCOUNTER — Other Ambulatory Visit (INDEPENDENT_AMBULATORY_CARE_PROVIDER_SITE_OTHER): Payer: Self-pay

## 2016-08-21 DIAGNOSIS — N912 Amenorrhea, unspecified: Secondary | ICD-10-CM

## 2016-08-21 DIAGNOSIS — Z3202 Encounter for pregnancy test, result negative: Secondary | ICD-10-CM

## 2016-08-21 LAB — POCT URINE PREGNANCY: PREG TEST UR: NEGATIVE

## 2016-08-21 NOTE — Progress Notes (Signed)
Pt here stating that she is 3 days late for period.  She does not use any birth control at the present.  She thinlks her period should have been 3 days ago.  UPT is neg.  Suggest that she wait another week and if she still hasn't had her period to make appt with a provider for further evaluation.

## 2016-10-16 ENCOUNTER — Emergency Department (HOSPITAL_COMMUNITY)
Admission: EM | Admit: 2016-10-16 | Discharge: 2016-10-17 | Disposition: A | Discharge status: Home / Self Care | Payer: Medicaid Other | Attending: Emergency Medicine | Admitting: Emergency Medicine

## 2016-10-16 ENCOUNTER — Encounter (HOSPITAL_COMMUNITY): Payer: Self-pay | Admitting: Emergency Medicine

## 2016-10-16 DIAGNOSIS — Z79899 Other long term (current) drug therapy: Secondary | ICD-10-CM | POA: Diagnosis not present

## 2016-10-16 DIAGNOSIS — J029 Acute pharyngitis, unspecified: Secondary | ICD-10-CM | POA: Diagnosis not present

## 2016-10-16 NOTE — ED Triage Notes (Signed)
Pt states she has had a sore throat for the past 4 days  Pt states she has a cough and congestion   Pt states her throat feels like it is tight and it is hard to swallow  Pt states her son has the same  Pt has been using OTC medication with out relief

## 2016-10-17 MED ORDER — PENICILLIN G BENZATHINE 1200000 UNIT/2ML IM SUSP
1.2000 10*6.[IU] | Freq: Once | INTRAMUSCULAR | Status: AC
  Administered 2016-10-17: 1.2 10*6.[IU] via INTRAMUSCULAR
  Filled 2016-10-17: qty 2

## 2016-10-17 NOTE — Discharge Instructions (Signed)
You have been treated for strep throat there.  Symptoms should start improving in the next 24-48 hours. You can use over the counter chloraseptic to help with throat pain.  Can use this multiples times a day, would be good to use prior to eating/drinking to ease discomfort. Follow-up with your primary care doctor if you have any ongoing issues. Return here for new concerns.

## 2016-10-17 NOTE — ED Provider Notes (Signed)
[facility] DEPT Provider Note   CSN: [id number] Arrival date & time: 10/16/16  2323     History   Chief Complaint Chief Complaint  Patient presents with  . Sore Throat    HPI Bethany Odonnell is a 31 y.o. female.  The history is provided by the patient and medical records.  Sore Throat     31 year old female with history of allergic rhinitis, anxiety, GERD, scoliosis, presenting to the ED for sore throat. Patient states she has had ongoing symptoms for about 4 days now. States initially started off with some nasal congestion and postnasal drip. She then developed a dry cough, but states it is more a sensation to clear her throat. States her throat has been the most painful. States today she reports severe pain when trying to eat or drink. States it is if she is swallowing razor blades. She has been using over-the-counter cough and cold medicine with decongestant but has not had any relief. Reports her 61-year-old son at home is sick with similar symptoms. She denies any fever or chills. States husband started getting sick today as well.  Past Medical History:  Diagnosis Date  . Allergic rhinitis   . Anxiety   . GERD (gastroesophageal reflux disease)   . IBS (irritable bowel syndrome)   . Scoliosis     Patient Active Problem List   Diagnosis Date Noted  . IUD (intrauterine device) in place 05/26/2015    Past Surgical History:  Procedure Laterality Date  . INDUCED ABORTION      OB History    Gravida Para Term Preterm AB Living   3 2 2   1 2    SAB TAB Ectopic Multiple Live Births         0 2       Home Medications    Prior to Admission medications   Medication Sig Start Date End Date Taking? Authorizing Provider  benzonatate (TESSALON) 100 MG capsule Take 1 capsule (100 mg total) by mouth every 8 (eight) hours. 04/04/16   Hope Bunnie Pion, NP  ibuprofen (ADVIL,MOTRIN) 600 MG tablet Take 1 tablet (600 mg total) by mouth every 6 (six) hours as needed. Patient not  taking: Reported on 01/12/2016 04/22/15   Myrtis Ser, CNM  meclizine (ANTIVERT) 12.5 MG tablet Take 1 tablet (12.5 mg total) by mouth 3 (three) times daily as needed for dizziness. 01/12/16   Delsa Grana, PA-C  metroNIDAZOLE (FLAGYL) 500 MG tablet Take 1 tablet (500 mg total) by mouth 2 (two) times daily. 01/22/16   Emily Filbert, MD  metroNIDAZOLE (METROGEL VAGINAL) 0.75 % vaginal gel Place 1 Applicatorful vaginally at bedtime. Patient not taking: Reported on 01/12/2016 01/06/16   Luvenia Redden, PA-C  ondansetron (ZOFRAN ODT) 8 MG disintegrating tablet Take 1 tablet (8 mg total) by mouth every 8 (eight) hours as needed for nausea or vomiting. Patient not taking: Reported on 01/12/2016 01/06/16   Luvenia Redden, PA-C  pseudoephedrine (SUDAFED) 30 MG tablet Take 1 tablet (30 mg total) by mouth every 4 (four) hours as needed for congestion. 04/04/16   Hope Bunnie Pion, NP  sodium chloride (OCEAN) 0.65 % SOLN nasal spray Place 1 spray into both nostrils as needed for congestion. 04/04/16   Hope Bunnie Pion, NP    Family History Family History  Problem Relation Age of Onset  . Alcohol abuse Father   . Drug abuse Father   . Birth defects Mother   . Depression Mother   . Miscarriages /  Stillbirths Mother   . Asthma Daughter   . Breast cancer Maternal Grandmother   . Lung cancer Maternal Grandfather   . Allergic rhinitis Daughter     Social History Social History  Substance Use Topics  . Smoking status: Never Smoker  . Smokeless tobacco: Never Used  . Alcohol use No     Comment: occasionally on special occasion, however none while pregnant     Allergies   Patient has no known allergies.   Review of Systems Review of Systems  HENT: Positive for congestion and sore throat.   All other systems reviewed and are negative.    Physical Exam Updated Vital Signs BP 126/61 (BP Location: Left Arm)   Pulse 90   Temp 98.3 F (36.8 C) (Oral)   Resp 18   LMP  (LMP Unknown)   SpO2 100%   Physical  Exam  Constitutional: She is oriented to person, place, and time. She appears well-developed and well-nourished.  HENT:  Head: Normocephalic and atraumatic.  Right Ear: Tympanic membrane and ear canal normal.  Left Ear: Tympanic membrane and ear canal normal.  Nose: Nose normal.  Mouth/Throat: Uvula is midline, oropharynx is clear and moist and mucous membranes are normal.  Tonsils 1+ bilaterally with small exudates noted; uvula midline without evidence of peritonsillar abscess; handling secretions appropriately; no difficulty swallowing or speaking; normal phonation without stridor  Eyes: Conjunctivae and EOM are normal. Pupils are equal, round, and reactive to light.  Neck: Normal range of motion.  Cardiovascular: Normal rate, regular rhythm and normal heart sounds.   Pulmonary/Chest: Effort normal and breath sounds normal. No respiratory distress. She has no wheezes. She has no rales.  Abdominal: Soft. Bowel sounds are normal.  Musculoskeletal: Normal range of motion.  Neurological: She is alert and oriented to person, place, and time.  Skin: Skin is warm and dry.  Psychiatric: She has a normal mood and affect.  Nursing note and vitals reviewed.    ED Treatments / Results  Labs (all labs ordered are listed, but only abnormal results are displayed) Labs Reviewed - No data to display  EKG  EKG Interpretation None       Radiology No results found.  Procedures Procedures (including critical care time)  Medications Ordered in ED Medications  penicillin g benzathine (BICILLIN LA) 1200000 UNIT/2ML injection 1.2 Million Units (1.2 Million Units Intramuscular Given 10/17/16 0020)     Initial Impression / Assessment and Plan / ED Course  I have reviewed the triage vital signs and the nursing notes.  Pertinent labs & imaging results that were available during my care of the patient were reviewed by me and considered in my medical decision making (see chart for  details).  31 year old female here with sore throat. Has been ongoing for 4 days. Husband and son also sick. She is afebrile and nontoxic. She does have tonsillar edema with exudates noted. She is handling her secretions well. Normal phonation without stridor. No uvular deviation. Lungs clear bilaterally.  Symptoms and physical exam findings are concerning for strep pharyngitis. Will treat with Bicillin here. Discussed supportive care at home. Follow-up with PCP if any ongoing issues. Work note given. Discussed plan with patient, she acknowledged understanding and agreed with plan of care.  Return precautions given for new or worsening symptoms.  Final Clinical Impressions(s) / ED Diagnoses   Final diagnoses:  Sore throat    New Prescriptions New Prescriptions   No medications on file     Larene Pickett,  PA-C 10/17/16 [place], MD 10/17/16 1450

## 2016-10-17 NOTE — ED Notes (Signed)
Pt ambulatory and independent at discharge.  Verbalized understanding of discharge instructions 

## 2016-12-17 IMAGING — CR DG CHEST 2V
2 series · 2 of 2 positions shown · non-contrast
Comparison: 07/19/2014.

CLINICAL DATA: Right-sided chest pain.  Recent fall.

EXAM:
CHEST  2 VIEW

[w chest pa]
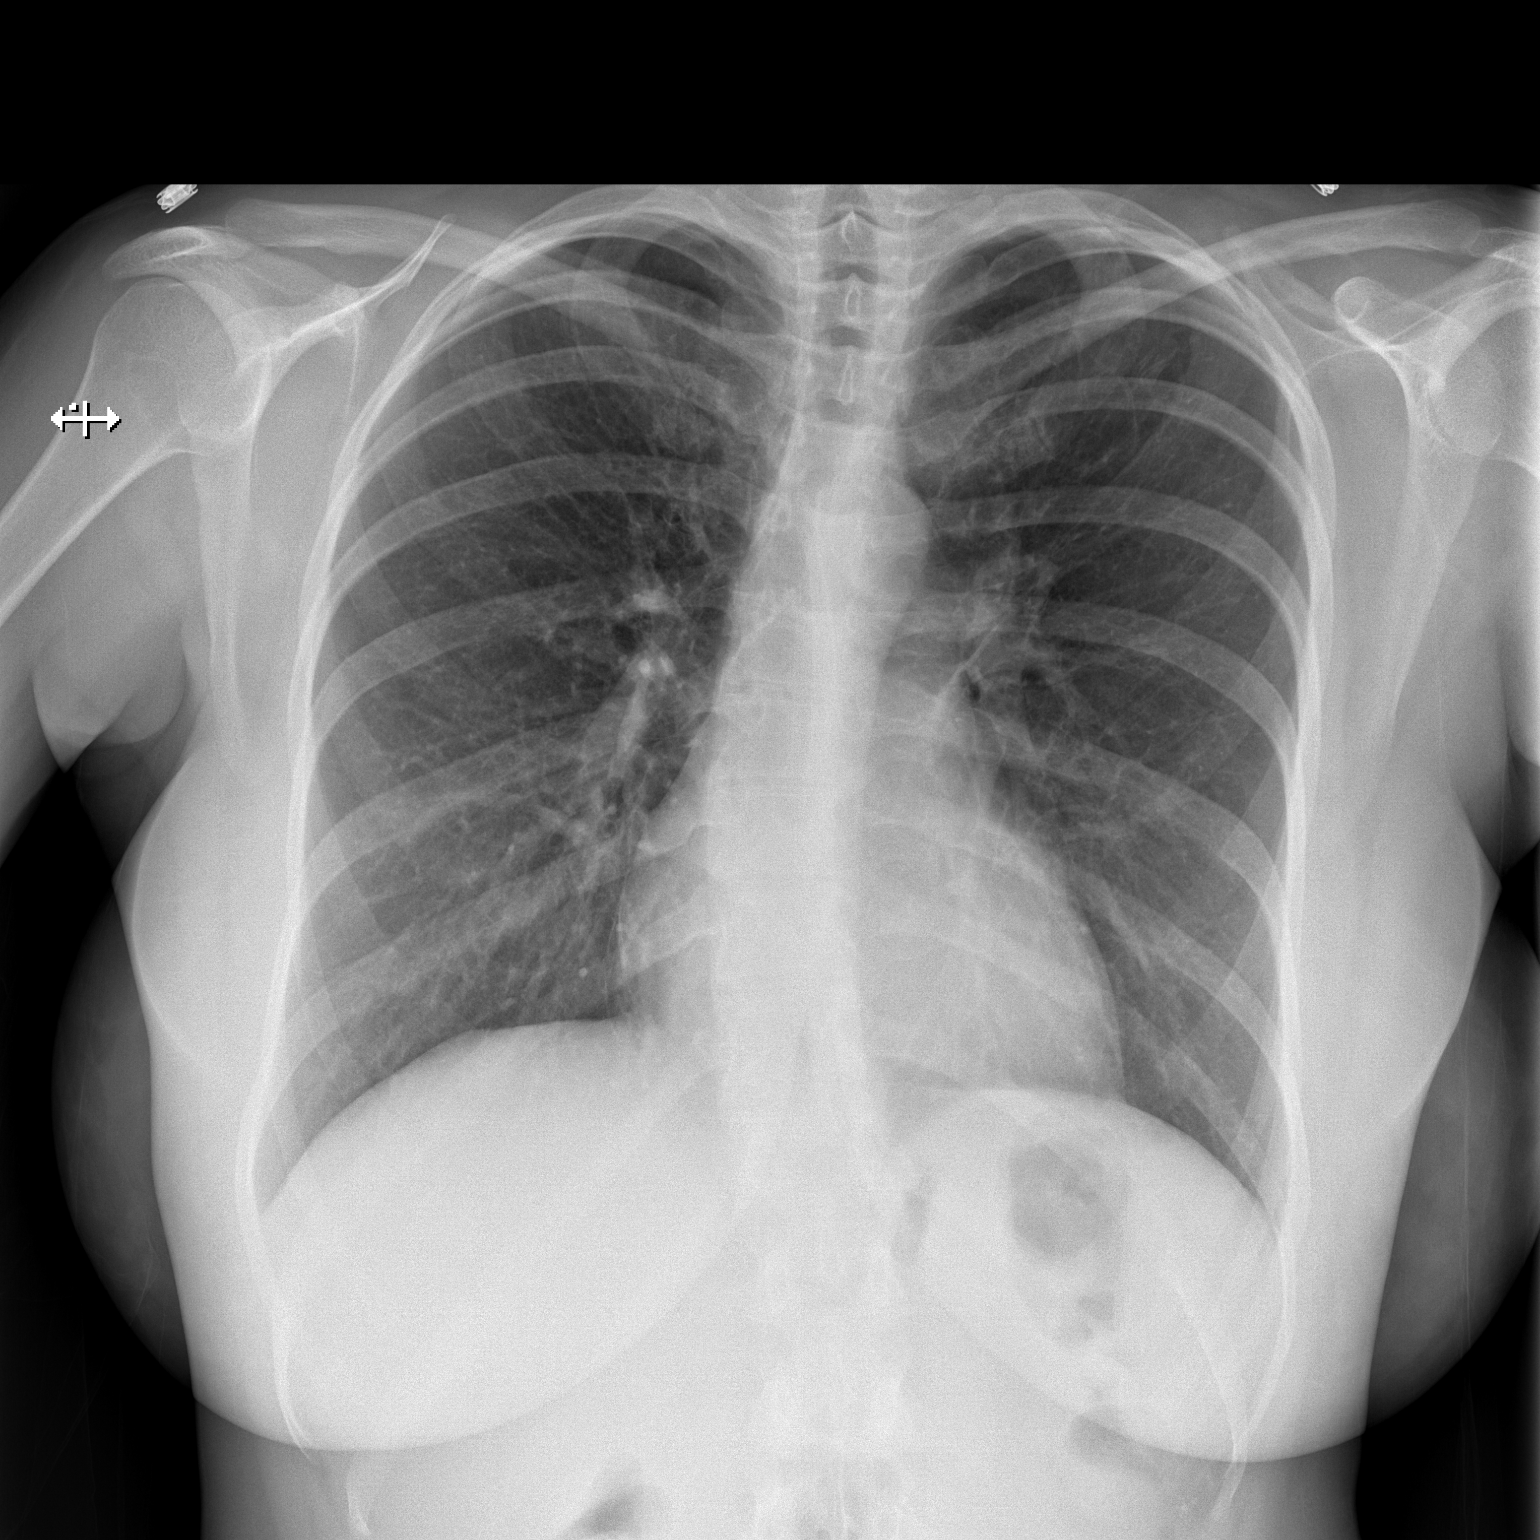

[w chest lat]
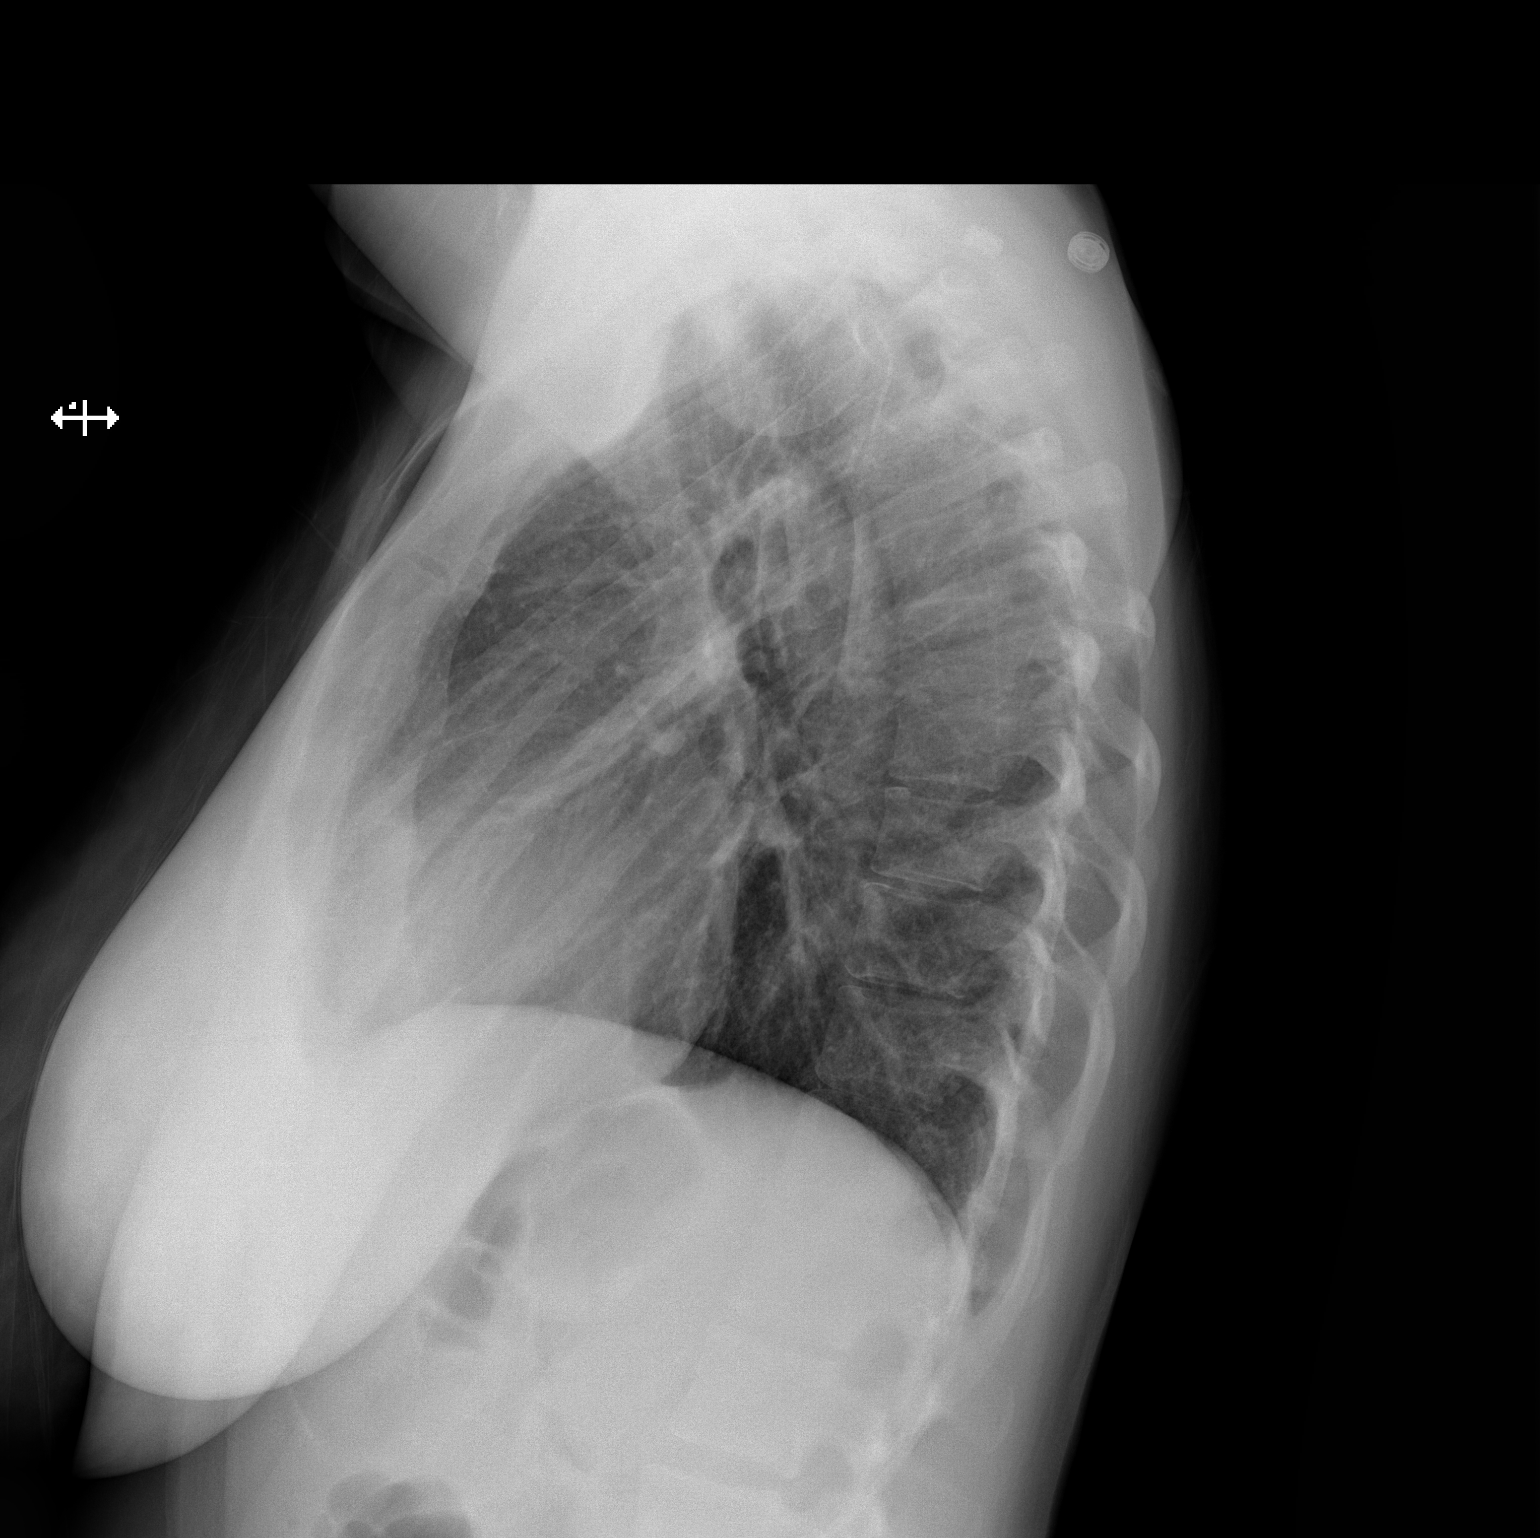

[2 of 2 positions shown; findings below may reference images not displayed]

FINDINGS: Mild scoliosis is stable, convex RIGHT. Normal cardiomediastinal
silhouette. Clear lung fields. No visible acute bony abnormality. No
effusion or pneumothorax.
IMPRESSION: Stable chest.  No active disease.

## 2019-09-03 ENCOUNTER — Ambulatory Visit: Payer: 59 | Attending: Internal Medicine

## 2019-09-03 ENCOUNTER — Other Ambulatory Visit: Payer: Self-pay

## 2019-09-03 DIAGNOSIS — Z23 Encounter for immunization: Secondary | ICD-10-CM

## 2019-09-03 NOTE — Progress Notes (Signed)
   Covid-19 Vaccination Clinic  Name:  Bethany Odonnell    MRN: [id number] DOB: Jun 11, 1986  09/03/2019  Ms. Shutler was observed post Covid-19 immunization for 15 minutes without incident. She was provided with Vaccine Information Sheet and instruction to access the V-Safe system.   Ms. Nagi was instructed to call 911 with any severe reactions post vaccine: Marland Kitchen Difficulty breathing  . Swelling of face and throat  . A fast heartbeat  . A bad rash all over body  . Dizziness and weakness   Immunizations Administered    Name Date Dose VIS Date Route   Pfizer COVID-19 Vaccine 09/03/2019  4:14 PM 0.3 mL 05/28/2019 Intramuscular   Manufacturer: [place]   Lot: [id number]   [place]: [id number]

## 2019-09-15 ENCOUNTER — Ambulatory Visit: Payer: 59 | Attending: Internal Medicine

## 2019-09-29 ENCOUNTER — Ambulatory Visit: Payer: 59 | Attending: Internal Medicine

## 2019-09-29 DIAGNOSIS — Z23 Encounter for immunization: Secondary | ICD-10-CM

## 2019-09-29 NOTE — Progress Notes (Signed)
   Covid-19 Vaccination Clinic  Name:  Graycen Velastegui    MRN: [id number] DOB: Dec 09, 1985  09/29/2019  Ms. Killins was observed post Covid-19 immunization for 15 minutes without incident. She was provided with Vaccine Information Sheet and instruction to access the V-Safe system.   Ms. Zatorski was instructed to call 911 with any severe reactions post vaccine: Marland Kitchen Difficulty breathing  . Swelling of face and throat  . A fast heartbeat  . A bad rash all over body  . Dizziness and weakness   Immunizations Administered    Name Date Dose VIS Date Route   Pfizer COVID-19 Vaccine 09/29/2019  4:15 PM 0.3 mL 05/28/2019 Intramuscular   Manufacturer: [place]   Lot: [id number]   [place]: [id number]

## 2019-12-30 DIAGNOSIS — Z5321 Procedure and treatment not carried out due to patient leaving prior to being seen by health care provider: Secondary | ICD-10-CM | POA: Diagnosis not present

## 2019-12-30 DIAGNOSIS — R21 Rash and other nonspecific skin eruption: Secondary | ICD-10-CM | POA: Insufficient documentation

## 2019-12-31 ENCOUNTER — Encounter (HOSPITAL_COMMUNITY): Payer: Self-pay

## 2019-12-31 ENCOUNTER — Emergency Department (HOSPITAL_COMMUNITY)
Admission: EM | Admit: 2019-12-31 | Discharge: 2019-12-31 | Disposition: A | Discharge status: Home / Self Care | Payer: 59 | Attending: Emergency Medicine | Admitting: Emergency Medicine

## 2019-12-31 NOTE — ED Triage Notes (Signed)
Pt reports that she noticed redness on her L pinky toe. Did not see anything bite her.

## 2021-05-22 ENCOUNTER — Other Ambulatory Visit: Payer: Self-pay

## 2021-05-22 ENCOUNTER — Encounter (HOSPITAL_BASED_OUTPATIENT_CLINIC_OR_DEPARTMENT_OTHER): Payer: Self-pay | Admitting: Emergency Medicine

## 2021-05-22 ENCOUNTER — Emergency Department (HOSPITAL_BASED_OUTPATIENT_CLINIC_OR_DEPARTMENT_OTHER)
Admission: EM | Admit: 2021-05-22 | Discharge: 2021-05-22 | Disposition: A | Discharge status: Home / Self Care | Payer: 59 | Attending: Emergency Medicine | Admitting: Emergency Medicine

## 2021-05-22 DIAGNOSIS — Z20822 Contact with and (suspected) exposure to covid-19: Secondary | ICD-10-CM | POA: Insufficient documentation

## 2021-05-22 DIAGNOSIS — B9789 Other viral agents as the cause of diseases classified elsewhere: Secondary | ICD-10-CM | POA: Diagnosis not present

## 2021-05-22 DIAGNOSIS — J069 Acute upper respiratory infection, unspecified: Secondary | ICD-10-CM

## 2021-05-22 DIAGNOSIS — R059 Cough, unspecified: Secondary | ICD-10-CM | POA: Diagnosis present

## 2021-05-22 LAB — RESP PANEL BY RT-PCR (FLU A&B, COVID) ARPGX2
Influenza A by PCR: NEGATIVE
Influenza B by PCR: NEGATIVE
SARS Coronavirus 2 by RT PCR: NEGATIVE

## 2021-05-22 LAB — GROUP A STREP BY PCR: Group A Strep by PCR: NOT DETECTED

## 2021-05-22 NOTE — ED Triage Notes (Signed)
Sore throat and now cough states is  now winded  for no reason x 4 days

## 2021-05-22 NOTE — ED Provider Notes (Signed)
[facility] Provider Note  CSN: [id number] Arrival date & time: 05/22/21 1117    History Chief Complaint  Patient presents with   Cough    Bethany Odonnell is a 35 y.o. female with history of strep pharyngitis reports sore throat for about 4 days, worse with swallowing, now also associated with nasal congestion, productive cough and hoarse voice. No fevers.    Past Medical History:  Diagnosis Date   Allergic rhinitis    Anxiety    GERD (gastroesophageal reflux disease)    IBS (irritable bowel syndrome)    Scoliosis     Past Surgical History:  Procedure Laterality Date   INDUCED ABORTION      Family History  Problem Relation Age of Onset   Alcohol abuse Father    Drug abuse Father    Birth defects Mother    Depression Mother    Miscarriages / [place] Mother    Asthma Daughter    Breast cancer Maternal Grandmother    Lung cancer Maternal Grandfather    Allergic rhinitis Daughter     Social History   Tobacco Use   Smoking status: Never   Smokeless tobacco: Never  Substance Use Topics   Alcohol use: No    Comment: occasionally on special occasion, however none while pregnant   Drug use: No     Home Medications Prior to Admission medications   Not on File     Allergies    Patient has no known allergies.   Review of Systems   Review of Systems A comprehensive review of systems was completed and negative except as noted in HPI.    Physical Exam BP 112/66   Pulse 72   Temp 98.3 F (36.8 C)   Resp 15   Ht 5\' 6"  (1.676 m)   Wt 76.2 kg   SpO2 100%   BMI 27.12 kg/m   Physical Exam Vitals and nursing note reviewed.  Constitutional:      Appearance: Normal appearance.  HENT:     Head: Normocephalic and atraumatic.     Right Ear: Tympanic membrane normal.     Left Ear: Tympanic membrane normal.     Nose: Nose normal.     Mouth/Throat:     Mouth: Mucous membranes are moist.     Tonsils: Tonsillar exudate  present. 2+ on the right. 2+ on the left.  Eyes:     Extraocular Movements: Extraocular movements intact.     Conjunctiva/sclera: Conjunctivae normal.  Cardiovascular:     Rate and Rhythm: Normal rate.  Pulmonary:     Effort: Pulmonary effort is normal.     Breath sounds: Normal breath sounds. No wheezing or rales.  Abdominal:     General: Abdomen is flat.     Palpations: Abdomen is soft.     Tenderness: There is no abdominal tenderness.  Musculoskeletal:        General: No swelling. Normal range of motion.     Cervical back: Neck supple.  Skin:    General: Skin is warm and dry.  Neurological:     General: No focal deficit present.     Mental Status: She is alert.  Psychiatric:        Mood and Affect: Mood normal.     ED Results / Procedures / Treatments   Labs (all labs ordered are listed, but only abnormal results are displayed) Labs Reviewed  RESP PANEL BY RT-PCR (FLU A&B, COVID) [id number]  GROUP A STREP BY  PCR    EKG None   Radiology No results found.  Procedures Procedures  Medications Ordered in the ED Medications - No data to display   MDM Rules/Calculators/A&P MDM Well appearing, normal vitals, has tonsillar exudate but no other Centor criteria. Will check for Strep. Covid/Flu are neg.   ED Course  I have reviewed the triage vital signs and the nursing notes.  Pertinent labs & imaging results that were available during my care of the patient were reviewed by me and considered in my medical decision making (see chart for details).  Clinical Course as of 05/22/21 1611  Tue May 22, 2021  1608 Strep is neg. Recommend symptomatic treatment for viral URI.  [CS]    Clinical Course User Index [CS] Truddie Hidden, MD    Final Clinical Impression(s) / ED Diagnoses Final diagnoses:  Viral URI with cough    Rx / DC Orders ED Discharge Orders     None        Truddie Hidden, MD 05/22/21 1611

## 2021-06-17 NOTE — L&D Delivery Note (Cosign Needed Addendum)
Patient: Bethany Odonnell MRN: [id number]  GBS status: positive, IAP given  Patient is a 36 y.o. now [id number] s/p NSVD at [redacted]w[redacted]d who was admitted for latent labor and NRNST. AROM 3h 346mrior to delivery with clear fluid.    Delivery Note At 10:12 PM a viable female was delivered via Vaginal, Spontaneous (Presentation: Left Occiput Anterior).  APGAR: 8,9; weight pending.   Placenta status: Spontaneous, Intact.  Cord: 3 vessels with the following complications: None.    Anesthesia: Epidural Episiotomy: None Small vaginal mucosal tear, hemostatic Suture Repair:  n/a Est. Blood Loss (mL): 5711Mom to postpartum.  Baby to Couplet care / Skin to Skin.  Madeleine Mendelow 04/21/2022, 10:27 PM   Head delivered LOA. Nuchal cord present x1 and reduced. Shoulder and body delivered in usual fashion. Infant with spontaneous cry, placed on mother's abdomen, dried and bulb suctioned. Cord clamped x 2 after 1-minute delay, and cut by FOB. Cord blood drawn. Placenta delivered spontaneously with gentle cord traction. Fundus firm with massage and Pitocin. Perineum inspected and found to have a small vaginal mucosal tear, which was found to be hemostatic. ____ GME ATTESTATION:  Evaluation and management procedures were performed by the [facility] under my supervision. I was immediately available for direct supervision, assistance and direction throughout this encounter.  I also confirm that I have verified the information documented in the resident's note, and that I have also personally reperformed the pertinent components of the physical exam and all of the medical decision making activities.  I have also made any necessary editorial changes.  JeShelda PalDO OB Fellow, [facility]1/10/2021 10:45 PM

## 2021-06-21 ENCOUNTER — Other Ambulatory Visit: Payer: Self-pay

## 2021-06-21 ENCOUNTER — Inpatient Hospital Stay (HOSPITAL_COMMUNITY)
Admission: AD | Admit: 2021-06-21 | Discharge: 2021-06-21 | Disposition: A | Discharge status: Home / Self Care | Payer: 59 | Attending: Obstetrics & Gynecology | Admitting: Obstetrics & Gynecology

## 2021-06-21 ENCOUNTER — Encounter (HOSPITAL_COMMUNITY): Payer: Self-pay | Admitting: Obstetrics & Gynecology

## 2021-06-21 DIAGNOSIS — O200 Threatened abortion: Secondary | ICD-10-CM | POA: Insufficient documentation

## 2021-06-21 DIAGNOSIS — R109 Unspecified abdominal pain: Secondary | ICD-10-CM | POA: Insufficient documentation

## 2021-06-21 DIAGNOSIS — O26891 Other specified pregnancy related conditions, first trimester: Secondary | ICD-10-CM | POA: Insufficient documentation

## 2021-06-21 DIAGNOSIS — Z3A01 Less than 8 weeks gestation of pregnancy: Secondary | ICD-10-CM | POA: Diagnosis not present

## 2021-06-21 DIAGNOSIS — Z6790 Unspecified blood type, Rh positive: Secondary | ICD-10-CM | POA: Insufficient documentation

## 2021-06-21 DIAGNOSIS — O26851 Spotting complicating pregnancy, first trimester: Secondary | ICD-10-CM | POA: Diagnosis not present

## 2021-06-21 LAB — URINALYSIS, ROUTINE W REFLEX MICROSCOPIC
Bilirubin Urine: NEGATIVE
Glucose, UA: NEGATIVE mg/dL
Ketones, ur: NEGATIVE mg/dL
Nitrite: NEGATIVE
Protein, ur: NEGATIVE mg/dL
Specific Gravity, Urine: 1.005 (ref 1.005–1.030)
pH: 8 (ref 5.0–8.0)

## 2021-06-21 LAB — HCG, QUANTITATIVE, PREGNANCY: hCG, Beta Chain, Quant, S: 11 m[IU]/mL — ABNORMAL HIGH (ref <5)

## 2021-06-21 LAB — POCT PREGNANCY, URINE: Preg Test, Ur: NEGATIVE

## 2021-06-21 NOTE — MAU Note (Addendum)
Julianne Handler CNM in [place] Rm to see pt and discuss plan of care. PT would like to wait for BHCG results

## 2021-06-21 NOTE — MAU Note (Signed)
Bethany Odonnell CNM in Triage to discuss lab result with pt and d/c plan. Written and verbal d/c instructions given and understanding voiced. ? Tissue pt brought in will not be sent to pathology per CNM. Pt d/c home by provider

## 2021-06-21 NOTE — MAU Provider Note (Signed)
S [name] is a 36 y.o. [phone] @[redacted]w[redacted]d  by sure LMP who presents to MAU today with complaint of VB and positive HPT. Reports 2 positive tests yesterday. Had spotting and cramping around noon today then around 1700 passed a clot and possibly tissue. Bleeding has slowed since.   ROS: +VB +cramping  O BP 125/61    Pulse 79    Temp 98.8 F (37.1 C)    Resp 17    Ht 5\' 6"  (1.676 m)    Wt 74.8 kg    LMP 05/17/2021    SpO2 100%    BMI 26.63 kg/m  Physical Exam Vitals and nursing note reviewed.  Constitutional:      General: She is not in acute distress.    Appearance: Normal appearance.  HENT:     Head: Normocephalic and atraumatic.  Pulmonary:     Effort: Pulmonary effort is normal. No respiratory distress.  Musculoskeletal:        General: Normal range of motion.     Cervical back: Normal range of motion.  Neurological:     General: No focal deficit present.     Mental Status: She is alert and oriented to person, place, and time.  Psychiatric:        Mood and Affect: Mood normal.        Behavior: Behavior normal.   Results for orders placed or performed during the hospital encounter of 06/21/21 (from the past 24 hour(s))  Urinalysis, Routine w reflex microscopic Urine, Clean Catch     Status: Abnormal   Collection Time: 06/21/21  8:27 PM  Result Value Ref Range   Color, Urine STRAW (A) YELLOW   APPearance CLEAR CLEAR   Specific Gravity, Urine 1.005 1.005 - 1.030   pH 8.0 5.0 - 8.0   Glucose, UA NEGATIVE NEGATIVE mg/dL   Hgb urine dipstick LARGE (A) NEGATIVE   Bilirubin Urine NEGATIVE NEGATIVE   Ketones, ur NEGATIVE NEGATIVE mg/dL   Protein, ur NEGATIVE NEGATIVE mg/dL   Nitrite NEGATIVE NEGATIVE   Leukocytes,Ua TRACE (A) NEGATIVE   RBC / HPF 0-5 0 - 5 RBC/hpf   WBC, UA 0-5 0 - 5 WBC/hpf   Bacteria, UA RARE (A) NONE SEEN   Squamous Epithelial / LPF 0-5 0 - 5  Pregnancy, urine POC     Status: None   Collection Time: 06/21/21  8:29 PM  Result Value Ref Range   Preg  Test, Ur NEGATIVE NEGATIVE  hCG, quantitative, pregnancy     Status: Abnormal   Collection Time: 06/21/21  9:06 PM  Result Value Ref Range   hCG, Beta Chain, Quant, S 11 (H) <5 mIU/mL   MDM: Pt brought with her what appears to be tissue, ?small IUGS. Likely failed pregnancy based on sx and low qhcg. Discussed with Dr. Roselie Awkward, will recheck quant in 1 week. Discussed results and plan with pt. Stable for discharge home.   A 1. Threatened miscarriage   2. Blood type, Rh positive    P Discharge from MAU in stable condition Follow up at [facility] in 1 week-message sent Warning signs for worsening condition that would warrant emergency follow-up discussed Pelvic rest  Allergies as of 06/21/2021   No Known Allergies      Medication List    You have not been prescribed any medications.     Bethany Odonnell, CNM 06/21/2021 10:27 PM

## 2021-06-21 NOTE — MAU Note (Addendum)
Took upt yesterday and it was positive. Today at 12n I started cramping and saw some pink spotting. Cramps continued and about 1700 I started clotting blood. I think I may have miscarried as I passed something that may be the baby. Bleeding has slowed now and the cramping has lessened. LMP 05/17/21 Pt brought in ? Tissue she passed tonight. Placed in labeled specimen cup and returned to pt to show provider

## 2021-06-28 ENCOUNTER — Other Ambulatory Visit (INDEPENDENT_AMBULATORY_CARE_PROVIDER_SITE_OTHER): Payer: 59

## 2021-06-28 ENCOUNTER — Other Ambulatory Visit: Payer: Self-pay

## 2021-06-28 DIAGNOSIS — O039 Complete or unspecified spontaneous abortion without complication: Secondary | ICD-10-CM

## 2021-06-28 LAB — HCG, QUANTITATIVE, PREGNANCY: HCG, Total, QN: <3 m[IU]/mL

## 2021-06-28 NOTE — Progress Notes (Signed)
Pt here for BHCG. Pt states she is not currently having any issues. Pt denies pain. Pt states bleeding stopped two days ago. Pt sent to lab with requisition.

## 2021-06-29 ENCOUNTER — Encounter: Payer: Self-pay | Admitting: *Deleted

## 2021-07-17 ENCOUNTER — Encounter: Payer: Self-pay | Admitting: Obstetrics and Gynecology

## 2021-07-17 ENCOUNTER — Other Ambulatory Visit (HOSPITAL_COMMUNITY)
Admission: RE | Admit: 2021-07-17 | Discharge: 2021-07-17 | Disposition: A | Discharge status: Home / Self Care | Payer: 59 | Source: Ambulatory Visit | Attending: Obstetrics and Gynecology | Admitting: Obstetrics and Gynecology

## 2021-07-17 ENCOUNTER — Other Ambulatory Visit: Payer: Self-pay

## 2021-07-17 ENCOUNTER — Ambulatory Visit (INDEPENDENT_AMBULATORY_CARE_PROVIDER_SITE_OTHER): Payer: 59 | Admitting: Obstetrics and Gynecology

## 2021-07-17 VITALS — BP 125/72 | HR 92 | Ht 66.0 in | Wt 166.0 lb

## 2021-07-17 DIAGNOSIS — Z113 Encounter for screening for infections with a predominantly sexual mode of transmission: Secondary | ICD-10-CM | POA: Insufficient documentation

## 2021-07-17 DIAGNOSIS — Z01419 Encounter for gynecological examination (general) (routine) without abnormal findings: Secondary | ICD-10-CM

## 2021-07-17 NOTE — Progress Notes (Signed)
GYNECOLOGY ANNUAL PREVENTATIVE CARE ENCOUNTER NOTE  History:     Bethany Odonnell is a 36 y.o. [phone] female here for a routine annual gynecologic exam.  Current complaints: none, desires pregnancy.   Denies abnormal vaginal bleeding, discharge, pelvic pain, problems with intercourse or other gynecologic concerns.   Partner has been unfaithful in the past, desires STI testing today. Recent SAB, last quant was 0. She is doing well mentally and physically. She would like more children. She is taking prenatal vitamins.    Gynecologic History Patient's last menstrual period was 06/21/2021. Contraception: none Last Pap: 2016.  Result was normal with negative HPV Last Mammogram: NA Last Colonoscopy: NA  Obstetric History OB History  Gravida Para Term Preterm AB Living  4 2 2   2 2   SAB IAB Ectopic Multiple Live Births  1     0 2    # Outcome Date GA Lbr Len/2nd Weight Sex Delivery Anes PTL Lv  4 Term 04/20/15 [redacted]w[redacted]d 11:45 / 01:05 7 lb 8.1 oz (3.405 kg) M Vag-Spont EPI  LIV  3 Term 09/23/09    F Vag-Spont   LIV  2 AB 2002          1 SAB             Past Medical History:  Diagnosis Date   Allergic rhinitis    Anxiety    GERD (gastroesophageal reflux disease)    IBS (irritable bowel syndrome)    Scoliosis     Past Surgical History:  Procedure Laterality Date   INDUCED ABORTION      No current outpatient medications on file prior to visit.   No current facility-administered medications on file prior to visit.    No Known Allergies  Social History:  reports that she has never smoked. She has never used smokeless tobacco. She reports that she does not drink alcohol and does not use drugs.  Family History  Problem Relation Age of Onset   Alcohol abuse Father    Drug abuse Father    Birth defects Mother    Depression Mother    Miscarriages / [place] Mother    Asthma Daughter    Breast cancer Maternal Grandmother    Lung cancer Maternal Grandfather    Allergic  rhinitis Daughter     The following portions of the patient's history were reviewed and updated as appropriate: allergies, current medications, past family history, past medical history, past social history, past surgical history and problem list.  Review of Systems Pertinent items noted in HPI and remainder of comprehensive ROS otherwise negative.  Physical Exam:  BP 125/72    Pulse 92    Ht 5\' 6"  (1.676 m)    Wt 166 lb (75.3 kg)    LMP 06/21/2021    Breastfeeding Unknown    BMI 26.79 kg/m  CONSTITUTIONAL: Well-developed, well-nourished female in no acute distress.  HENT:  Normocephalic, atraumatic, External right and left ear normal.  EYES: Conjunctivae and EOM are normal. Pupils are equal, round, and reactive to light. No scleral icterus.  NECK: Normal range of motion, supple, no masses.  Normal thyroid.  SKIN: Skin is warm and dry. No rash noted. Not diaphoretic. No erythema. No pallor. MUSCULOSKELETAL: Normal range of motion. No tenderness.  No cyanosis, clubbing, or edema. NEUROLOGIC: Alert and oriented to person, place, and time. Normal reflexes, muscle tone coordination.  PSYCHIATRIC: Normal mood and affect. Normal behavior. Normal judgment and thought content. CARDIOVASCULAR: Normal heart rate noted,  regular rhythm RESPIRATORY: Clear to auscultation bilaterally. Effort and breath sounds normal, no problems with respiration noted. BREASTS: Symmetric in size. No masses, tenderness, skin changes, nipple drainage, or lymphadenopathy bilaterally. Performed in the presence of a chaperone. ABDOMEN: Soft, no distention noted.  No tenderness, rebound or guarding.  PELVIC: Normal appearing external genitalia and urethral meatus; normal appearing vaginal mucosa and cervix.  No abnormal vaginal discharge noted.  Pap smear obtained.  Normal uterine size, no other palpable masses, no uterine or adnexal tenderness.  Performed in the presence of a chaperone.   Assessment and Plan:    1. Routine  screening for STI (sexually transmitted infection)  - Cytology - PAP( [place]) - Cervicovaginal ancillary only( [place]) - HIV antibody (with reflex) - Hepatitis C Antibody - Hepatitis B Surface AntiGEN - [facility]  2. Well woman exam  - Cytology - PAP( [place]) - Cervicovaginal ancillary only( [place])  Will follow up results of pap smear and manage accordingly. Routine preventative health maintenance measures emphasized. Please refer to After Visit Summary for other counseling recommendations.    Wrenley Sayed, Artist Pais, [place] for [employer], [place]

## 2021-07-17 NOTE — Progress Notes (Signed)
Pt requested STI testing Pt thinks she has BV

## 2021-07-18 LAB — CYTOLOGY - PAP
Chlamydia: NEGATIVE
Comment: NEGATIVE
Comment: NEGATIVE
Comment: NEGATIVE
Comment: NORMAL
Diagnosis: NEGATIVE
High risk HPV: NEGATIVE
Neisseria Gonorrhea: POSITIVE — AB
Trichomonas: NEGATIVE

## 2021-07-18 LAB — CERVICOVAGINAL ANCILLARY ONLY
Bacterial Vaginitis (gardnerella): POSITIVE — AB
Candida Glabrata: NEGATIVE
Candida Vaginitis: NEGATIVE
Comment: NEGATIVE
Comment: NEGATIVE
Comment: NEGATIVE

## 2021-07-18 LAB — HEPATITIS C ANTIBODY
Hepatitis C Ab: NONREACTIVE
SIGNAL TO CUT-OFF: 0.03 (ref <1.00)

## 2021-07-18 LAB — HEPATITIS B SURFACE ANTIGEN: Hepatitis B Surface Ag: NONREACTIVE

## 2021-07-18 LAB — RPR: RPR Ser Ql: NONREACTIVE

## 2021-07-18 LAB — HIV ANTIBODY (ROUTINE TESTING W REFLEX): HIV 1&2 Ab, 4th Generation: NONREACTIVE

## 2021-07-19 ENCOUNTER — Other Ambulatory Visit: Payer: Self-pay | Admitting: Obstetrics and Gynecology

## 2021-07-19 MED ORDER — METRONIDAZOLE 500 MG PO TABS: 500.0000 mg | ORAL_TABLET | Freq: Two times a day (BID) | ORAL | 0 refills | Status: AC

## 2021-07-23 ENCOUNTER — Other Ambulatory Visit: Payer: Self-pay

## 2021-07-23 ENCOUNTER — Ambulatory Visit (INDEPENDENT_AMBULATORY_CARE_PROVIDER_SITE_OTHER): Payer: 59 | Admitting: *Deleted

## 2021-07-23 DIAGNOSIS — A549 Gonococcal infection, unspecified: Secondary | ICD-10-CM

## 2021-07-23 MED ORDER — CEFTRIAXONE SODIUM 250 MG IJ SOLR
250.0000 mg | Freq: Once | INTRAMUSCULAR | Status: AC
  Administered 2021-07-23: 250 mg via INTRAMUSCULAR

## 2021-07-23 MED ORDER — CEFTRIAXONE SODIUM 250 MG IJ SOLR
250.0000 mg | Freq: Once | INTRAMUSCULAR | Status: AC
Start: 2021-07-23 — End: 2021-07-23
  Administered 2021-07-23: 250 mg via INTRAMUSCULAR

## 2021-07-23 NOTE — Progress Notes (Signed)
Pt here for Rocephin 500 mg IM to treat GC.  0.9 cc of 1% Lidocaine mixed into 2 vials of 250 mg of Rocephin and given IM RUOQ per Vo J Rasch,NP  NDC [id number]  Exp 5/24  After 10 minutes of receiving the shot pt states that she feel fine and she was discharged.

## 2021-07-27 NOTE — Progress Notes (Signed)
Chart reviewed for nurse visit. Agree with plan of care.   Lezlie Lye, NP 07/27/2021 9:29 AM

## 2021-08-27 ENCOUNTER — Inpatient Hospital Stay (HOSPITAL_COMMUNITY)
Admission: AD | Admit: 2021-08-27 | Discharge: 2021-08-27 | Disposition: A | Discharge status: Home / Self Care | Payer: 59 | Attending: Obstetrics and Gynecology | Admitting: Obstetrics and Gynecology

## 2021-08-27 ENCOUNTER — Encounter (HOSPITAL_COMMUNITY): Payer: Self-pay | Admitting: Obstetrics and Gynecology

## 2021-08-27 ENCOUNTER — Inpatient Hospital Stay (HOSPITAL_COMMUNITY): Payer: 59

## 2021-08-27 ENCOUNTER — Other Ambulatory Visit: Payer: Self-pay

## 2021-08-27 DIAGNOSIS — R300 Dysuria: Secondary | ICD-10-CM | POA: Insufficient documentation

## 2021-08-27 DIAGNOSIS — O26891 Other specified pregnancy related conditions, first trimester: Secondary | ICD-10-CM | POA: Diagnosis not present

## 2021-08-27 DIAGNOSIS — O98811 Other maternal infectious and parasitic diseases complicating pregnancy, first trimester: Secondary | ICD-10-CM | POA: Insufficient documentation

## 2021-08-27 DIAGNOSIS — Z3A01 Less than 8 weeks gestation of pregnancy: Secondary | ICD-10-CM | POA: Insufficient documentation

## 2021-08-27 DIAGNOSIS — B9689 Other specified bacterial agents as the cause of diseases classified elsewhere: Secondary | ICD-10-CM | POA: Insufficient documentation

## 2021-08-27 DIAGNOSIS — R102 Pelvic and perineal pain: Secondary | ICD-10-CM | POA: Diagnosis not present

## 2021-08-27 DIAGNOSIS — O3411 Maternal care for benign tumor of corpus uteri, first trimester: Secondary | ICD-10-CM | POA: Diagnosis not present

## 2021-08-27 DIAGNOSIS — O0941 Supervision of pregnancy with grand multiparity, first trimester: Secondary | ICD-10-CM | POA: Insufficient documentation

## 2021-08-27 DIAGNOSIS — D251 Intramural leiomyoma of uterus: Secondary | ICD-10-CM | POA: Diagnosis not present

## 2021-08-27 DIAGNOSIS — O99891 Other specified diseases and conditions complicating pregnancy: Secondary | ICD-10-CM | POA: Diagnosis not present

## 2021-08-27 DIAGNOSIS — O23591 Infection of other part of genital tract in pregnancy, first trimester: Secondary | ICD-10-CM | POA: Diagnosis not present

## 2021-08-27 DIAGNOSIS — O3680X0 Pregnancy with inconclusive fetal viability, not applicable or unspecified: Secondary | ICD-10-CM | POA: Diagnosis present

## 2021-08-27 DIAGNOSIS — O09291 Supervision of pregnancy with other poor reproductive or obstetric history, first trimester: Secondary | ICD-10-CM | POA: Diagnosis not present

## 2021-08-27 DIAGNOSIS — B3731 Acute candidiasis of vulva and vagina: Secondary | ICD-10-CM | POA: Diagnosis not present

## 2021-08-27 LAB — CBC
HCT: 39.3 % (ref 36.0–46.0)
Hemoglobin: 13 g/dL (ref 12.0–15.0)
MCH: 30.6 pg (ref 26.0–34.0)
MCHC: 33.1 g/dL (ref 30.0–36.0)
MCV: 92.5 fL (ref 80.0–100.0)
Platelets: 271 10*3/uL (ref 150–400)
RBC: 4.25 MIL/uL (ref 3.87–5.11)
RDW: 12.6 % (ref 11.5–15.5)
WBC: 7.8 10*3/uL (ref 4.0–10.5)
nRBC: 0 % (ref 0.0–0.2)

## 2021-08-27 LAB — URINALYSIS, ROUTINE W REFLEX MICROSCOPIC
Bilirubin Urine: NEGATIVE
Glucose, UA: NEGATIVE mg/dL
Hgb urine dipstick: NEGATIVE
Ketones, ur: NEGATIVE mg/dL
Nitrite: NEGATIVE
Protein, ur: NEGATIVE mg/dL
Specific Gravity, Urine: 1.009 (ref 1.005–1.030)
pH: 6 (ref 5.0–8.0)

## 2021-08-27 LAB — COMPREHENSIVE METABOLIC PANEL
ALT: 10 U/L (ref 0–44)
AST: 17 U/L (ref 15–41)
Albumin: 3.8 g/dL (ref 3.5–5.0)
Alkaline Phosphatase: 58 U/L (ref 38–126)
Anion gap: 10 (ref 5–15)
BUN: 6 mg/dL (ref 6–20)
CO2: 21 mmol/L — ABNORMAL LOW (ref 22–32)
Calcium: 8.8 mg/dL — ABNORMAL LOW (ref 8.9–10.3)
Chloride: 105 mmol/L (ref 98–111)
Creatinine, Ser: 0.66 mg/dL (ref 0.44–1.00)
GFR, Estimated: >60 mL/min (ref >60)
Glucose, Bld: 80 mg/dL (ref 70–99)
Potassium: 3.9 mmol/L (ref 3.5–5.1)
Sodium: 136 mmol/L (ref 135–145)
Total Bilirubin: 0.5 mg/dL (ref 0.3–1.2)
Total Protein: 6.8 g/dL (ref 6.5–8.1)

## 2021-08-27 LAB — HCG, QUANTITATIVE, PREGNANCY: hCG, Beta Chain, Quant, S: 1023 m[IU]/mL — ABNORMAL HIGH (ref <5)

## 2021-08-27 LAB — WET PREP, GENITAL
Sperm: NONE SEEN
Trich, Wet Prep: NONE SEEN
WBC, Wet Prep HPF POC: <10 (ref <10)

## 2021-08-27 LAB — OB RESULTS CONSOLE GBS: GBS: POSITIVE

## 2021-08-27 LAB — POCT PREGNANCY, URINE: Preg Test, Ur: POSITIVE — AB

## 2021-08-27 MED ORDER — TERCONAZOLE 0.8 % VA CREA: 1.0000 | TOPICAL_CREAM | Freq: Every day | VAGINAL | 0 refills | Status: DC

## 2021-08-27 MED ORDER — METRONIDAZOLE 500 MG PO TABS: 500.0000 mg | ORAL_TABLET | Freq: Two times a day (BID) | ORAL | 0 refills | Status: DC

## 2021-08-27 NOTE — MAU Note (Addendum)
PT SAYS LMP 07-23-2021 ?NO CYCLE IN MARCH ?DID HPT- POSITIVE -LAST NIGHT  ?CALLED [place] OFFICE TODAY FOR APPOINTMENT- 10-02-2021 ?TONIGHT - HURTS TO VOID  ?AND D/C ITCHES- STARTED Friday  ?TAKES BUBBLE BATHS ?NO OTC MEDS  ?FEELS ABD CRAMPING- STARTED FRIDAY ? ? ?

## 2021-08-27 NOTE — Discharge Instructions (Signed)

## 2021-08-27 NOTE — MAU Provider Note (Signed)
?History  ?  ? ?CSN: [id number] ? ?Arrival date and time: 08/27/21 1921 ? ? Event Date/Time  ? First Provider Initiated Contact with Patient 08/27/21 2030   ?  ? ?Chief Complaint  ?Patient presents with  ? Abdominal Pain  ? ?HPI ?Bethany Odonnell is a 36 y.o. [id number] at 64w0dwho presents to MAU with chief complaint of dysuria. This is a new problem, onset last night. Patient endorses 5/10 pain in her suprapubic region. Pain does not radiate. She has not taken medication for this complaint. ? ?Patient also reports new onset vaginal discharge. Onset of complaint: last Friday 03/10. She is s/p treatment for Gonorrhea in January 2023.  ? ?Patient states she is s/p miscarriage in February 2023. ? ?[facility] ? ?OB History   ? ? Gravida  ?5  ? Para  ?2  ? Term  ?2  ? Preterm  ?   ? AB  ?2  ? Living  ?2  ?  ? ? SAB  ?1  ? IAB  ?   ? Ectopic  ?   ? Multiple  ?0  ? Live Births  ?2  ?   ?  ?  ? ? ?Past Medical History:  ?Diagnosis Date  ? Allergic rhinitis   ? Anxiety   ? GERD (gastroesophageal reflux disease)   ? IBS (irritable bowel syndrome)   ? Scoliosis   ? ? ?Past Surgical History:  ?Procedure Laterality Date  ? INDUCED ABORTION    ? ? ?Family History  ?Problem Relation Age of Onset  ? Alcohol abuse Father   ? Drug abuse Father   ? Birth defects Mother   ? Depression Mother   ? Miscarriages / [place]   ? Asthma Daughter   ? Breast cancer Maternal Grandmother   ? Lung cancer Maternal Grandfather   ? Allergic rhinitis Daughter   ? ? ?Social History  ? ?Tobacco Use  ? Smoking status: Never  ? Smokeless tobacco: Never  ?Vaping Use  ? Vaping Use: Never used  ?Substance Use Topics  ? Alcohol use: No  ?  Comment: occasionally on special occasion, however none while pregnant  ? Drug use: No  ? ? ?Allergies: No Known Allergies ? ?No medications prior to admission.  ? ? ?Review of Systems  ?Gastrointestinal:  Positive for abdominal pain.  ?Genitourinary:  Positive for dysuria. Negative for flank pain  and vaginal discharge.  ?All other systems reviewed and are negative. ?Physical Exam  ? ?Blood pressure (!) 106/59, pulse 82, temperature 98.3 ?F (36.8 ?C), temperature source Oral, resp. rate 20, height '5\' 6"'$  (1.676 m), weight 75.2 kg, last menstrual period 07/23/2021, unknown if currently breastfeeding. ? ?Physical Exam ?Vitals and nursing note reviewed. Exam conducted with a chaperone present.  ?Constitutional:   ?   Appearance: She is well-developed. She is not ill-appearing.  ?Cardiovascular:  ?   Rate and Rhythm: Normal rate and regular rhythm.  ?   Heart sounds: Normal heart sounds.  ?Pulmonary:  ?   Effort: Pulmonary effort is normal.  ?   Breath sounds: Normal breath sounds.  ?Abdominal:  ?   General: Abdomen is flat. Bowel sounds are normal.  ?   Palpations: Abdomen is soft.  ?   Tenderness: There is no abdominal tenderness. There is no right CVA tenderness or left CVA tenderness.  ?Skin: ?   General: Skin is warm and dry.  ?   Capillary Refill: Capillary refill takes less than 2  seconds.  ?Neurological:  ?   Mental Status: She is alert and oriented to person, place, and time.  ?Psychiatric:     ?   Mood and Affect: Mood normal.     ?   Behavior: Behavior normal.  ? ? ?MAU Course  ?Procedures ? ?MDM ?Patient Vitals for the past 24 hrs: ? BP Temp Temp src Pulse Resp Height Weight  ?08/27/21 2155 -- -- -- -- 18 -- --  ?08/27/21 2018 (!) 106/59 98.3 ?F (36.8 ?C) Oral 82 20 '5\' 6"'$  (1.676 m) 75.2 kg  ? ?Results for orders placed or performed during the hospital encounter of 08/27/21 (from the past 24 hour(s))  ?Urinalysis, Routine w reflex microscopic Urine, Clean Catch     Status: Abnormal  ? Collection Time: 08/27/21  7:21 PM  ?Result Value Ref Range  ? Color, Urine YELLOW YELLOW  ? APPearance HAZY (A) CLEAR  ? Specific Gravity, Urine 1.009 1.005 - 1.030  ? pH 6.0 5.0 - 8.0  ? Glucose, UA NEGATIVE NEGATIVE mg/dL  ? Hgb urine dipstick NEGATIVE NEGATIVE  ? Bilirubin Urine NEGATIVE NEGATIVE  ? Ketones, ur  NEGATIVE NEGATIVE mg/dL  ? Protein, ur NEGATIVE NEGATIVE mg/dL  ? Nitrite NEGATIVE NEGATIVE  ? Leukocytes,Ua TRACE (A) NEGATIVE  ? RBC / HPF 0-5 0 - 5 RBC/hpf  ? WBC, UA 0-5 0 - 5 WBC/hpf  ? Bacteria, UA RARE (A) NONE SEEN  ? Squamous Epithelial / LPF 6-10 0 - 5  ? Mucus PRESENT   ?Pregnancy, urine POC     Status: Abnormal  ? Collection Time: 08/27/21  8:24 PM  ?Result Value Ref Range  ? Preg Test, Ur POSITIVE (A) NEGATIVE  ?Wet prep, genital     Status: Abnormal  ? Collection Time: 08/27/21  8:40 PM  ?Result Value Ref Range  ? Yeast Wet Prep HPF POC PRESENT (A) NONE SEEN  ? Trich, Wet Prep NONE SEEN NONE SEEN  ? Clue Cells Wet Prep HPF POC PRESENT (A) NONE SEEN  ? WBC, Wet Prep HPF POC <10 <10  ? Sperm NONE SEEN   ?CBC     Status: None  ? Collection Time: 08/27/21  8:49 PM  ?Result Value Ref Range  ? WBC 7.8 4.0 - 10.5 K/uL  ? RBC 4.25 3.87 - 5.11 MIL/uL  ? Hemoglobin 13.0 12.0 - 15.0 g/dL  ? HCT 39.3 36.0 - 46.0 %  ? MCV 92.5 80.0 - 100.0 fL  ? MCH 30.6 26.0 - 34.0 pg  ? MCHC 33.1 30.0 - 36.0 g/dL  ? RDW 12.6 11.5 - 15.5 %  ? Platelets 271 150 - 400 K/uL  ? nRBC 0.0 0.0 - 0.2 %  ?hCG, quantitative, pregnancy     Status: Abnormal  ? Collection Time: 08/27/21  8:49 PM  ?Result Value Ref Range  ? hCG, Beta Chain, Quant, S 1,023 (H) <5 mIU/mL  ?Comprehensive metabolic panel     Status: Abnormal  ? Collection Time: 08/27/21  8:49 PM  ?Result Value Ref Range  ? Sodium 136 135 - 145 mmol/L  ? Potassium 3.9 3.5 - 5.1 mmol/L  ? Chloride 105 98 - 111 mmol/L  ? CO2 21 (L) 22 - 32 mmol/L  ? Glucose, Bld 80 70 - 99 mg/dL  ? BUN 6 6 - 20 mg/dL  ? Creatinine, Ser 0.66 0.44 - 1.00 mg/dL  ? Calcium 8.8 (L) 8.9 - 10.3 mg/dL  ? Total Protein 6.8 6.5 - 8.1 g/dL  ? Albumin 3.8 3.5 -  5.0 g/dL  ? AST 17 15 - 41 U/L  ? ALT 10 0 - 44 U/L  ? Alkaline Phosphatase 58 38 - 126 U/L  ? Total Bilirubin 0.5 0.3 - 1.2 mg/dL  ? GFR, Estimated >60 >60 mL/min  ? Anion gap 10 5 - 15  ? ?US OB Transvaginal ? ?Result Date: 08/27/2021 ?CLINICAL DATA:   Pelvic cramping. LMP: 07/23/2021 corresponding to an estimated gestational age of [redacted] weeks, 0 days. EXAM: TRANSVAGINAL OB ULTRASOUND TECHNIQUE: Transvaginal ultrasound was performed for complete evaluation of the gestation as well as the maternal uterus, adnexal regions, and pelvic cul-de-sac. COMPARISON:  None. FINDINGS: The uterus is anteverted. There is a 1 cm left posterior body intramural fibroid. The endometrium is unremarkable and measures approximately 14 mm in thickness. No intrauterine pregnancy identified. The ovaries are unremarkable. There is a corpus luteum in the left ovary. IMPRESSION: No intrauterine pregnancy identified and no suspicious adnexal masses noted. Findings consistent with pregnancy of unknown location and differential diagnosis includes: An early IUP, recent spontaneous miscarriage, or an occult ectopic pregnancy. Clinical correlation and follow-up with serial HCG levels and repeat ultrasound in 7-11 days, or earlier if clinically indicated, recommended. Electronically Signed   By: Anner Crete M.D.   On: 08/27/2021 21:34   ? ?Meds ordered this encounter  ?Medications  ? terconazole (TERAZOL 3) 0.8 % vaginal cream  ?  Sig: Place 1 applicator vaginally at bedtime. Apply nightly for three nights.  ?  Dispense:  20 g  ?  Refill:  0  ?  Order Specific Question:   Supervising Provider  ?  Answer:   Griffin Basil [[id number]]  ? metroNIDAZOLE (FLAGYL) 500 MG tablet  ?  Sig: Take 1 tablet (500 mg total) by mouth 2 (two) times daily.  ?  Dispense:  14 tablet  ?  Refill:  0  ?  Order Specific Question:   Supervising Provider  ?  Answer:   Griffin Basil [[id number]]  ? ?Assessment and Plan  ?--36 y.o. [id number] with pregnancy of unknown location ?--Quant hCG 1023 ?--Yeast infection, Bacterial Vaginosis, rx to pharmacy ?--Discharge home in stable condition with ectopic precautions ? ?F/U: ?--Unable to schedule repeat blood work in [place] ?--High alert message sent to [place] to please schedule  repeat stat Quant hCG 08/30/2021 ? ?Darlina Rumpf, MSA, MSN, CNM ?08/27/2021, 11:17 PM  ?

## 2021-08-28 LAB — GC/CHLAMYDIA PROBE AMP (~~LOC~~) NOT AT ARMC
Chlamydia: NEGATIVE
Comment: NEGATIVE
Comment: NORMAL
Neisseria Gonorrhea: NEGATIVE

## 2021-08-29 LAB — CULTURE, OB URINE: Culture: 2000 — AB

## 2021-08-30 ENCOUNTER — Encounter: Payer: Self-pay | Admitting: Family Medicine

## 2021-08-30 ENCOUNTER — Other Ambulatory Visit: Payer: Self-pay

## 2021-08-30 ENCOUNTER — Telehealth: Payer: Self-pay | Admitting: *Deleted

## 2021-08-30 ENCOUNTER — Other Ambulatory Visit (INDEPENDENT_AMBULATORY_CARE_PROVIDER_SITE_OTHER): Payer: 59

## 2021-08-30 LAB — HCG, QUANTITATIVE, PREGNANCY: HCG, Total, QN: 3465 m[IU]/mL

## 2021-08-30 NOTE — Progress Notes (Signed)
Pt here for STAT BHCG per Mallie Snooks, CNM. Pt denies any pain or bleeding. Pt states cramping has subsided and she is having no issues. Pt is aware we will contact with results.  ?

## 2021-08-30 NOTE — Telephone Encounter (Signed)
Pt notified of HCG levels today  which were 3465.  I spoke with Dr Elly Modena who agreed that the levels have gone up appropriately and pt has NOB scheduled for 10/02/21.  Advised patient to continue to monitor any severe pain or bleeding.  Pt to also start a prenatal vitamin ?

## 2021-09-11 ENCOUNTER — Telehealth: Payer: Self-pay

## 2021-09-11 DIAGNOSIS — O219 Vomiting of pregnancy, unspecified: Secondary | ICD-10-CM

## 2021-09-11 MED ORDER — PROMETHAZINE HCL 25 MG PO TABS: 25.0000 mg | ORAL_TABLET | Freq: Four times a day (QID) | ORAL | 1 refills | Status: DC | PRN

## 2021-09-11 NOTE — Telephone Encounter (Signed)
Pt called requesting medication for nausea and vomiting during pregnancy. Pt is an estabilshed pt of ours and has NOB appt on 10/02/21. Phenergan sent.  ?

## 2021-10-01 ENCOUNTER — Encounter: Payer: Self-pay | Admitting: *Deleted

## 2021-10-01 DIAGNOSIS — Z3480 Encounter for supervision of other normal pregnancy, unspecified trimester: Secondary | ICD-10-CM | POA: Insufficient documentation

## 2021-10-02 ENCOUNTER — Other Ambulatory Visit (HOSPITAL_COMMUNITY)
Admission: RE | Admit: 2021-10-02 | Discharge: 2021-10-02 | Disposition: A | Discharge status: Home / Self Care | Payer: 59 | Source: Ambulatory Visit | Attending: Advanced Practice Midwife | Admitting: Advanced Practice Midwife

## 2021-10-02 ENCOUNTER — Ambulatory Visit (INDEPENDENT_AMBULATORY_CARE_PROVIDER_SITE_OTHER): Payer: 59 | Admitting: Advanced Practice Midwife

## 2021-10-02 VITALS — Wt 163.0 lb

## 2021-10-02 DIAGNOSIS — Z3A10 10 weeks gestation of pregnancy: Secondary | ICD-10-CM

## 2021-10-02 DIAGNOSIS — Z3480 Encounter for supervision of other normal pregnancy, unspecified trimester: Secondary | ICD-10-CM | POA: Diagnosis present

## 2021-10-02 DIAGNOSIS — O219 Vomiting of pregnancy, unspecified: Secondary | ICD-10-CM | POA: Diagnosis not present

## 2021-10-02 DIAGNOSIS — O98211 Gonorrhea complicating pregnancy, first trimester: Secondary | ICD-10-CM

## 2021-10-02 MED ORDER — METOCLOPRAMIDE HCL 10 MG PO TABS: 10.0000 mg | ORAL_TABLET | Freq: Three times a day (TID) | ORAL | 2 refills | Status: DC | PRN

## 2021-10-02 MED ORDER — DOXYLAMINE-PYRIDOXINE 10-10 MG PO TBEC: DELAYED_RELEASE_TABLET | ORAL | 5 refills | Status: DC

## 2021-10-02 NOTE — Progress Notes (Signed)
?  ? ?Subjective:  ? ?Bethany Odonnell is a 36 y.o. [id number] at 73w1dby LMP, c/w today's bedside [place] seen today for her first obstetrical visit.  Her obstetrical history is significant for  2 prior vaginal deliveries  and has GBS bacteriuria and Supervision of other normal pregnancy, antepartum on their problem list.. Patient does intend to breast feed. Pregnancy history fully reviewed. ? ?Patient reports nausea and vomiting. ? ?HISTORY: ?OB History  ?Gravida Para Term Preterm AB Living  ?'5 2 2 '$ 0 2 2  ?SAB IAB Ectopic Multiple Live Births  ?1 0 0 0 2  ?  ?# Outcome Date GA Lbr Len/2nd Weight Sex Delivery Anes PTL Lv  ?5 Current           ?4 Term 04/20/15 36w1d1:45 / 01:05 7 lb 8.1 oz (3.405 kg) M Vag-Spont EPI  LIV  ?   Name: PIPALLAVI, CLIFTON?   Apgar1: 9  Apgar5: 10  ?3 Term 09/23/09    F Vag-Spont   LIV  ?2 AB 2002          ?1 SAB           ? ?Past Medical History:  ?Diagnosis Date  ? Allergic rhinitis   ? Anxiety   ? GERD (gastroesophageal reflux disease)   ? IBS (irritable bowel syndrome)   ? Scoliosis   ? ?Past Surgical History:  ?Procedure Laterality Date  ? INDUCED ABORTION    ? ?Family History  ?Problem Relation Age of Onset  ? Alcohol abuse Father   ? Drug abuse Father   ? Birth defects Mother   ? Depression Mother   ? Miscarriages / St[place]other   ? Asthma Daughter   ? Breast cancer Maternal Grandmother   ? Lung cancer Maternal Grandfather   ? Allergic rhinitis Daughter   ? ?Social History  ? ?Tobacco Use  ? Smoking status: Never  ? Smokeless tobacco: Never  ?Vaping Use  ? Vaping Use: Never used  ?Substance Use Topics  ? Alcohol use: No  ?  Comment: occasionally on special occasion, however none while pregnant  ? Drug use: No  ? ?No Known Allergies ?Current Outpatient Medications on File Prior to Visit  ?Medication Sig Dispense Refill  ? Prenatal Vit-Fe Fumarate-FA (PRENATAL VITAMINS PO) Take by mouth.    ? promethazine (PHENERGAN) 25 MG tablet Take 1 tablet (25 mg total) by mouth every 6  (six) hours as needed for nausea or vomiting. (Patient not taking: Reported on 10/02/2021) 30 tablet 1  ? ?No current facility-administered medications on file prior to visit.  ? ? ? Indications for ASA therapy (per uptodate) ?One of the following: ?Previous pregnancy with preeclampsia, especially early onset and with an adverse outcome No ?Multifetal gestation No ?Chronic hypertension No ?Type 1 or 2 diabetes mellitus No ?Chronic kidney disease No ?Autoimmune disease (antiphospholipid syndrome, systemic lupus erythematosus) No ? ? Two or more of the following: ?Nulliparity No ?Obesity (body mass index >30 kg/m2) No ?Family history of preeclampsia in mother or sister No ?Age ?35 years No ?Sociodemographic characteristics (African American race, low socioeconomic level) No ?Personal risk factors (eg, previous pregnancy with low birth weight or small for gestational age infant, previous adverse pregnancy outcome [eg, stillbirth], interval >10 years between pregnancies) No ? ? Indications for early 1 hour GTT (per uptodate) ? ?BMI >25 (>23 in Asian women) AND one of the following ? ?Gestational diabetes mellitus in a previous pregnancy No ?Glycated hemoglobin ?5.7  percent (39 mmol/mol), impaired glucose tolerance, or impaired fasting glucose on previous testing No ?First-degree relative with diabetes No ?High-risk race/ethnicity (eg, African American, Latino, Native American, [place] American, Pacific Islander) No ?History of cardiovascular disease No ?Hypertension or on therapy for hypertension No ?High-density lipoprotein cholesterol level <35 mg/dL (0.90 mmol/L) and/or a triglyceride level >250 mg/dL (2.82 mmol/L) No ?Polycystic ovary syndrome No ?Physical inactivity No ?Other clinical condition associated with insulin resistance (eg, severe obesity, acanthosis nigricans) No ?Previous birth of an infant weighing ?4000 g No ?Previous stillbirth of unknown cause No ?Exam  ? ?Vitals:  ? 10/02/21 0959  ?Weight: 163 lb  (73.9 kg)  ? ?  ? ?Uterus:     ?Pelvic Exam: Perineum: no hemorrhoids, normal perineum  ? Vulva: normal external genitalia, no lesions  ? Vagina:  normal mucosa, normal discharge  ? Cervix: no lesions and normal, pap smear done.   ? Adnexa: normal adnexa and no mass, fullness, tenderness  ? Bony Pelvis: average  ?System: General: well-developed, well-nourished female in no acute distress  ? Breast:  normal appearance, no masses or tenderness  ? Skin: normal coloration and turgor, no rashes  ? Neurologic: oriented, normal, negative, normal mood  ? Extremities: normal strength, tone, and muscle mass, ROM of all joints is normal  ? HEENT PERRLA, extraocular movement intact and sclera clear, anicteric  ? Mouth/Teeth mucous membranes moist, pharynx normal without lesions and dental hygiene good  ? Neck supple and no masses  ? Cardiovascular: regular rate and rhythm  ? Respiratory:  no respiratory distress, normal breath sounds  ? Abdomen: soft, non-tender; bowel sounds normal; no masses,  no organomegaly  ? ?  ?Assessment:  ? ?Pregnancy: [id number] ?Patient Active Problem List  ? Diagnosis Date Noted  ? Supervision of other normal pregnancy, antepartum 10/01/2021  ? GBS bacteriuria 08/30/2021  ? ?  ?Plan:  ?1. Supervision of other normal pregnancy, antepartum ?--Anticipatory guidance about next visits/weeks of pregnancy given.  ?- Obstetric panel ?- Hepatitis C Antibody ?- Culture, OB Urine ?- Cervicovaginal ancillary only( [place]) ?- [facility] AMB BABYSCRIPTS OPT IN ?- HIV antibody (with reflex) ? ?2. Nausea and vomiting during pregnancy prior to [redacted] weeks gestation ?--Reviewed dietary changes ?- Doxylamine-Pyridoxine (DICLEGIS) 10-10 MG TBEC; Take 2 tabs at bedtime. If needed, add another tab in the morning. If needed, add another tab in the afternoon, up to 4 tabs/day.  Dispense: 100 tablet; Refill: 5 ?- metoCLOPramide (REGLAN) 10 MG tablet; Take 1 tablet (10 mg total) by mouth 3 (three) times daily with meals as needed  for nausea.  Dispense: 30 tablet; Refill: 2 ? ?3.  10 weeks of pregnancy ? ? ?Initial labs drawn. ?Continue prenatal vitamins. ?Discussed and offered genetic screening options, including Quad screen/AFP, NIPS testing, and option to decline testing. Benefits/risks/alternatives reviewed. Pt aware that anatomy US is form of genetic screening with lower accuracy in detecting trisomies than blood work.  Pt chooses genetic screening today. NIPS: requested. ?Ultrasound discussed; fetal anatomic survey: requested. ?Problem list reviewed and updated. ?The nature of [facility] with multiple MDs and other Advanced Practice Providers was explained to patient; also emphasized that residents, students are part of our team. ?Routine obstetric precautions reviewed. ?Return in about 4 weeks (around 10/30/2021) for LOB. ? ? ?Fatima Blank, CNM ?10/02/21 ?10:46 AM  ?

## 2021-10-02 NOTE — Progress Notes (Signed)
Bedside U/S shows single active IUP with FHT of 165 BPM  CRL measuring 29.28 mm  GA [redacted]w[redacted]d ?

## 2021-10-03 LAB — CERVICOVAGINAL ANCILLARY ONLY
Chlamydia: NEGATIVE
Comment: NEGATIVE
Comment: NEGATIVE
Comment: NORMAL
Neisseria Gonorrhea: NEGATIVE
Trichomonas: NEGATIVE

## 2021-10-03 LAB — OBSTETRIC PANEL
Absolute Monocytes: 360 cells/uL (ref 200–950)
Antibody Screen: NOT DETECTED
Basophils Absolute: 22 cells/uL (ref 0–200)
Basophils Relative: 0.3 %
Eosinophils Absolute: 94 cells/uL (ref 15–500)
Eosinophils Relative: 1.3 %
HCT: 35.9 % (ref 35.0–45.0)
Hemoglobin: 12.3 g/dL (ref 11.7–15.5)
Hepatitis B Surface Ag: NONREACTIVE
Lymphs Abs: 1375 cells/uL (ref 850–3900)
MCH: 31.1 pg (ref 27.0–33.0)
MCHC: 34.3 g/dL (ref 32.0–36.0)
MCV: 90.9 fL (ref 80.0–100.0)
MPV: 11.9 fL (ref 7.5–12.5)
Monocytes Relative: 5 %
Neutro Abs: 5350 cells/uL (ref 1500–7800)
Neutrophils Relative %: 74.3 %
Platelets: 227 10*3/uL (ref 140–400)
RBC: 3.95 10*6/uL (ref 3.80–5.10)
RDW: 12.5 % (ref 11.0–15.0)
RPR Ser Ql: NONREACTIVE
Rubella: 1.09 Index
Total Lymphocyte: 19.1 %
WBC: 7.2 10*3/uL (ref 3.8–10.8)

## 2021-10-03 LAB — HIV ANTIBODY (ROUTINE TESTING W REFLEX): HIV 1&2 Ab, 4th Generation: NONREACTIVE

## 2021-10-03 LAB — HEPATITIS C ANTIBODY
Hepatitis C Ab: NONREACTIVE
SIGNAL TO CUT-OFF: 0.05 (ref <1.00)

## 2021-10-04 LAB — SPECIMEN STATUS REPORT

## 2021-10-04 LAB — URINE CULTURE, OB REFLEX: Organism ID, Bacteria: NO GROWTH

## 2021-10-04 LAB — CULTURE, OB URINE

## 2021-10-16 ENCOUNTER — Ambulatory Visit (INDEPENDENT_AMBULATORY_CARE_PROVIDER_SITE_OTHER): Payer: 59 | Admitting: *Deleted

## 2021-10-16 ENCOUNTER — Other Ambulatory Visit (HOSPITAL_COMMUNITY)
Admission: RE | Admit: 2021-10-16 | Discharge: 2021-10-16 | Disposition: A | Discharge status: Home / Self Care | Payer: 59 | Source: Ambulatory Visit | Attending: Certified Nurse Midwife | Admitting: Certified Nurse Midwife

## 2021-10-16 DIAGNOSIS — Z3480 Encounter for supervision of other normal pregnancy, unspecified trimester: Secondary | ICD-10-CM

## 2021-10-16 DIAGNOSIS — N898 Other specified noninflammatory disorders of vagina: Secondary | ICD-10-CM | POA: Diagnosis not present

## 2021-10-16 NOTE — Progress Notes (Signed)
Pt here for Nipts and she does c/o of vaginal odor since using a scented toilet paper.  She is doing a self swab as well. ?

## 2021-10-17 LAB — CERVICOVAGINAL ANCILLARY ONLY
Bacterial Vaginitis (gardnerella): POSITIVE — AB
Candida Glabrata: NEGATIVE
Candida Vaginitis: NEGATIVE
Comment: NEGATIVE
Comment: NEGATIVE
Comment: NEGATIVE

## 2021-10-18 ENCOUNTER — Telehealth: Payer: Self-pay | Admitting: *Deleted

## 2021-10-18 MED ORDER — METRONIDAZOLE 0.75 % VA GEL: 1.0000 | Freq: Every day | VAGINAL | 0 refills | Status: DC

## 2021-10-18 NOTE — Telephone Encounter (Signed)
Pt is positive for BV pregnant.  She is requesting Metrogel instead of PO Flagyl per protocol. ?

## 2021-10-23 ENCOUNTER — Encounter: Payer: Self-pay | Admitting: *Deleted

## 2021-10-23 DIAGNOSIS — Z3480 Encounter for supervision of other normal pregnancy, unspecified trimester: Secondary | ICD-10-CM

## 2021-10-23 NOTE — Progress Notes (Signed)
Pt notified of Panorama results and the gender ?

## 2021-10-30 ENCOUNTER — Ambulatory Visit (INDEPENDENT_AMBULATORY_CARE_PROVIDER_SITE_OTHER): Payer: 59

## 2021-10-30 VITALS — BP 98/67 | HR 90 | Wt 162.0 lb

## 2021-10-30 DIAGNOSIS — Z3A14 14 weeks gestation of pregnancy: Secondary | ICD-10-CM

## 2021-10-30 DIAGNOSIS — Z3480 Encounter for supervision of other normal pregnancy, unspecified trimester: Secondary | ICD-10-CM

## 2021-10-30 DIAGNOSIS — Z3482 Encounter for supervision of other normal pregnancy, second trimester: Secondary | ICD-10-CM

## 2021-10-30 DIAGNOSIS — R8271 Bacteriuria: Secondary | ICD-10-CM

## 2021-10-30 NOTE — Progress Notes (Signed)
? ?  PRENATAL VISIT NOTE ? ?Subjective:  ?Bethany Odonnell is a 36 y.o. E7N1700 at 50w1dbeing seen today for ongoing prenatal care.  She is currently monitored for the following issues for this low-risk pregnancy and has GBS bacteriuria and Supervision of other normal pregnancy, antepartum on their problem list. ? ?Patient reports no complaints.  Contractions: Not present. Vag. Bleeding: None.  Movement: Absent. Denies leaking of fluid.  ? ?The following portions of the patient's history were reviewed and updated as appropriate: allergies, current medications, past family history, past medical history, past social history, past surgical history and problem list.  ? ?Objective:  ? ?Vitals:  ? 10/30/21 1018  ?BP: 98/67  ?Pulse: 90  ?Weight: 162 lb (73.5 kg)  ? ? ?Fetal Status: Fetal Heart Rate (bpm): 149   Movement: Absent    ? ?General:  Alert, oriented and cooperative. Patient is in no acute distress.  ?Skin: Skin is warm and dry. No rash noted.   ?Cardiovascular: Normal heart rate noted  ?Respiratory: Normal respiratory effort, no problems with respiration noted  ?Abdomen: Soft, gravid, appropriate for gestational age.  Pain/Pressure: Absent     ?Pelvic: Cervical exam deferred        ?Extremities: Normal range of motion.  Edema: None  ?Mental Status: Normal mood and affect. Normal behavior. Normal judgment and thought content.  ? ?Assessment and Plan:  ?Pregnancy: [id number] 17w1d1. Supervision of other normal pregnancy, antepartum ?- Routine OB. Doing well, no concerns today ?- Difficulty sleeping, but using body pillow helps. Reviewed good sleep hygiene habits ?- N/V improving ?- Anticipatory guidance for upcoming appointments provided ?- Anatomy USKoreardered ? ?- US[place]oreaFM OB COMP + 14 WK; Future ? ?2. [redacted] weeks gestation of pregnancy ? ? ?3. GBS bacteriuria ?- PCN in labor ? ? ?Preterm labor symptoms and general obstetric precautions including but not limited to vaginal bleeding, contractions, leaking of fluid and  fetal movement were reviewed in detail with the patient. ?Please refer to After Visit Summary for other counseling recommendations.  ? ?Return in about 4 weeks (around 11/27/2021). ? ?Future Appointments  ?Date Time Provider [place]?11/27/2021 10:10 AM HoTresea MallCNM [facility]  ? ? ?DaRenee HarderCNM ? ?

## 2021-11-27 ENCOUNTER — Inpatient Hospital Stay (HOSPITAL_COMMUNITY)
Admission: AD | Admit: 2021-11-27 | Discharge: 2021-11-27 | Disposition: A | Discharge status: Home / Self Care | Payer: 59 | Attending: Obstetrics & Gynecology | Admitting: Obstetrics & Gynecology

## 2021-11-27 ENCOUNTER — Encounter: Payer: 59 | Admitting: Advanced Practice Midwife

## 2021-11-27 DIAGNOSIS — B3731 Acute candidiasis of vulva and vagina: Secondary | ICD-10-CM | POA: Insufficient documentation

## 2021-11-27 DIAGNOSIS — Z3A18 18 weeks gestation of pregnancy: Secondary | ICD-10-CM | POA: Diagnosis not present

## 2021-11-27 DIAGNOSIS — Z79899 Other long term (current) drug therapy: Secondary | ICD-10-CM | POA: Diagnosis not present

## 2021-11-27 DIAGNOSIS — O23592 Infection of other part of genital tract in pregnancy, second trimester: Secondary | ICD-10-CM | POA: Diagnosis present

## 2021-11-27 DIAGNOSIS — O98812 Other maternal infectious and parasitic diseases complicating pregnancy, second trimester: Secondary | ICD-10-CM | POA: Diagnosis not present

## 2021-11-27 DIAGNOSIS — Z3480 Encounter for supervision of other normal pregnancy, unspecified trimester: Secondary | ICD-10-CM

## 2021-11-27 LAB — WET PREP, GENITAL
Clue Cells Wet Prep HPF POC: NONE SEEN
Sperm: NONE SEEN
Trich, Wet Prep: NONE SEEN
WBC, Wet Prep HPF POC: >=10 — AB (ref <10)

## 2021-11-27 MED ORDER — TERCONAZOLE 0.4 % VA CREA
1.0000 | TOPICAL_CREAM | Freq: Every day | VAGINAL | 0 refills | Status: DC
Start: 2021-11-27 — End: 2021-12-03

## 2021-11-27 NOTE — MAU Note (Signed)
.  Bethany Odonnell is a 36 y.o. at 62w1dhere in MAU reporting yesterday noticed white, clumpy d/c like a yeast infection. Some itching. Today the d/c is still clumpy but green and was concerned. Reports feeling baby move a lot today.  Onset of complaint: yesterday Pain score: 0 0    FHT:140 Lab orders placed from triage:  none

## 2021-11-27 NOTE — MAU Note (Signed)
Fatima Blank CNM in Triage to discuss test results and d/c plan.

## 2021-11-27 NOTE — MAU Note (Addendum)
Pt instructed in obtaining vag swab for wet prep which she did without difficulty. PT wants to be tested for STIs as well. Fatima Blank CNM aware and order added. Pt collected swab for GC/Chlam as well

## 2021-11-27 NOTE — Progress Notes (Signed)
Bethany Odonnell CNM saw pt in Family Rm to discuss test results and d/c plan. Pt then d/c home by CNM from [facility]

## 2021-11-27 NOTE — MAU Provider Note (Signed)
Chief Complaint: Vaginal Discharge   None     SUBJECTIVE HPI: Bethany Odonnell is a 36 y.o. [id number] at 51w1dby early ultrasound who presents to maternity admissions reporting green vaginal discharge with itching. She was treated BV with Flagyl 2-3 weeks ago.  She reports hx of gonorrhea so is concerned about STDs.  There are no other symptoms.    HPI  Past Medical History:  Diagnosis Date   Allergic rhinitis    Anxiety    GERD (gastroesophageal reflux disease)    IBS (irritable bowel syndrome)    Scoliosis    Past Surgical History:  Procedure Laterality Date   INDUCED ABORTION     Social History   Socioeconomic History   Marital status: Single    Spouse name: CLysbeth Galas  Number of children: 1   Years of education: Not on file   Highest education level: Not on file  Occupational History   Occupation: unemployed    Comment: plans to go back to work after pregnancy  Tobacco Use   Smoking status: Never   Smokeless tobacco: Never  Vaping Use   Vaping Use: Never used  Substance and Sexual Activity   Alcohol use: No    Comment: occasionally on special occasion, however none while pregnant   Drug use: No   Sexual activity: Yes    Partners: Male    Birth control/protection: None  Other Topics Concern   Not on file  Social History Narrative   Lives with husband and 523year-old daughter in a condo. Feels safe and stable there. No other significant social history.   Social Determinants of Health   Financial Resource Strain: Not on file  Food Insecurity: Not on file  Transportation Needs: Not on file  Physical Activity: Not on file  Stress: Not on file  Social Connections: Not on file  Intimate Partner Violence: Not on file   No current facility-administered medications on file prior to encounter.   Current Outpatient Medications on File Prior to Encounter  Medication Sig Dispense Refill   Doxylamine-Pyridoxine (DICLEGIS) 10-10 MG TBEC Take 2 tabs at bedtime. If needed,  add another tab in the morning. If needed, add another tab in the afternoon, up to 4 tabs/day. (Patient not taking: Reported on 10/30/2021) 100 tablet 5   metoCLOPramide (REGLAN) 10 MG tablet Take 1 tablet (10 mg total) by mouth 3 (three) times daily with meals as needed for nausea. (Patient not taking: Reported on 10/30/2021) 30 tablet 2   metroNIDAZOLE (METROGEL VAGINAL) 0.75 % vaginal gel Place 1 Applicatorful vaginally at bedtime. Place 1 applicator vaginally for 5 nights (Patient not taking: Reported on 10/30/2021) 70 g 0   Prenatal Vit-Fe Fumarate-FA (PRENATAL VITAMINS PO) Take by mouth.     promethazine (PHENERGAN) 25 MG tablet Take 1 tablet (25 mg total) by mouth every 6 (six) hours as needed for nausea or vomiting. (Patient not taking: Reported on 10/02/2021) 30 tablet 1   No Known Allergies  ROS:  Review of Systems   I have reviewed patient's Past Medical Hx, Surgical Hx, Family Hx, Social Hx, medications and allergies.   Physical Exam  Patient Vitals for the past 24 hrs:  BP Temp Pulse Resp SpO2 Height Weight  11/27/21 2019 114/62 -- -- -- -- -- --  11/27/21 2018 -- 98.9 F (37.2 C) 89 17 100 % '5\' 6"'$  (1.676 m) 76.2 kg   Constitutional: Well-developed, well-nourished female in no acute distress.  Cardiovascular: normal rate Respiratory: normal effort GI:  Abd soft, non-tender. Pos BS x 4 MS: Extremities nontender, no edema, normal ROM Neurologic: Alert and oriented x 4.  GU: Neg CVAT.  PELVIC EXAM: Cervix pink, visually closed, without lesion, scant white creamy discharge, vaginal walls and external genitalia normal Bimanual exam: Cervix 0/long/high, firm, anterior, neg CMT, uterus nontender, nonenlarged, adnexa without tenderness, enlargement, or mass  FHT 140 by doppler  LAB RESULTS Results for orders placed or performed during the hospital encounter of 11/27/21 (from the past 24 hour(s))  Wet prep, genital     Status: Abnormal   Collection Time: 11/27/21  8:35 PM    Specimen: Vaginal  Result Value Ref Range   Yeast Wet Prep HPF POC PRESENT (A) NONE SEEN   Trich, Wet Prep NONE SEEN NONE SEEN   Clue Cells Wet Prep HPF POC NONE SEEN NONE SEEN   WBC, Wet Prep HPF POC >=10 (A) <10   Sperm NONE SEEN     O/RH(D) POSITIVE/-- (04/18 1141)  IMAGING No results found.  MAU Management/MDM: Orders Placed This Encounter  Procedures   Wet prep, genital   Discharge patient    Meds ordered this encounter  Medications   terconazole (TERAZOL 7) 0.4 % vaginal cream    Sig: Place 1 applicator vaginally at bedtime.    Dispense:  45 g    Refill:  0    Order Specific Question:   Supervising Provider    Answer:   Woodroe Mode [[id number]]    Positive for yeast on wet prep today. GCC pending.  Rx sent to 24 hour pharmacy so pt can start treatment tonight.  F/U if symptoms persist.  Keep scheduled appts at [place] 1. Supervision of other normal pregnancy, antepartum   2. Vaginal candidiasis   3. [redacted] weeks gestation of pregnancy     PLAN Discharge home Allergies as of 11/27/2021   No Known Allergies      Medication List     STOP taking these medications    metroNIDAZOLE 0.75 % vaginal gel Commonly known as: METROGEL VAGINAL       TAKE these medications    Doxylamine-Pyridoxine 10-10 MG Tbec Commonly known as: Diclegis Take 2 tabs at bedtime. If needed, add another tab in the morning. If needed, add another tab in the afternoon, up to 4 tabs/day.   metoCLOPramide 10 MG tablet Commonly known as: Reglan Take 1 tablet (10 mg total) by mouth 3 (three) times daily with meals as needed for nausea.   PRENATAL VITAMINS PO Take by mouth.   promethazine 25 MG tablet Commonly known as: PHENERGAN Take 1 tablet (25 mg total) by mouth every 6 (six) hours as needed for nausea or vomiting.   terconazole 0.4 % vaginal cream Commonly known as: TERAZOL 7 Place 1 applicator vaginally at bedtime.        Follow-up [place] for  [facility] Follow up.   Specialty: Obstetrics and Gynecology Why: As scheduled Contact information: [place], [place] [facility] Follow up.   Specialty: Obstetrics and Gynecology Why: As needed for emergencies Contact information: [address] [phone] Haskell Sheldon [place] Certified Nurse-Midwife 11/27/2021  10:59 PM

## 2021-11-28 LAB — GC/CHLAMYDIA PROBE AMP (~~LOC~~) NOT AT ARMC
Chlamydia: NEGATIVE
Comment: NEGATIVE
Comment: NORMAL
Neisseria Gonorrhea: NEGATIVE

## 2021-12-03 ENCOUNTER — Ambulatory Visit (INDEPENDENT_AMBULATORY_CARE_PROVIDER_SITE_OTHER): Payer: 59 | Admitting: Obstetrics & Gynecology

## 2021-12-03 ENCOUNTER — Other Ambulatory Visit: Payer: Self-pay | Admitting: *Deleted

## 2021-12-03 ENCOUNTER — Ambulatory Visit: Payer: 59

## 2021-12-03 DIAGNOSIS — D259 Leiomyoma of uterus, unspecified: Secondary | ICD-10-CM | POA: Insufficient documentation

## 2021-12-03 DIAGNOSIS — Z3480 Encounter for supervision of other normal pregnancy, unspecified trimester: Secondary | ICD-10-CM

## 2021-12-03 DIAGNOSIS — Z363 Encounter for antenatal screening for malformations: Secondary | ICD-10-CM | POA: Diagnosis present

## 2021-12-03 DIAGNOSIS — Z3A19 19 weeks gestation of pregnancy: Secondary | ICD-10-CM

## 2021-12-03 DIAGNOSIS — O3412 Maternal care for benign tumor of corpus uteri, second trimester: Secondary | ICD-10-CM | POA: Diagnosis not present

## 2021-12-03 NOTE — Progress Notes (Unsigned)
   PRENATAL VISIT NOTE  Subjective:  Bethany Odonnell is a 36 y.o. L3T3428 at 9w0dbeing seen today for ongoing prenatal care.  She is currently monitored for the following issues for this low-risk pregnancy and has GBS bacteriuria and Supervision of other normal pregnancy, antepartum on their problem list.  Patient reports  resolved yeast symptoms .   .  .   . Denies leaking of fluid.   The following portions of the patient's history were reviewed and updated as appropriate: allergies, current medications, past family history, past medical history, past social history, past surgical history and problem list.   Objective:  There were no vitals filed for this visit.  Fetal Status:           General:  Alert, oriented and cooperative. Patient is in no acute distress.  Skin: Skin is warm and dry. No rash noted.   Cardiovascular: Normal heart rate noted  Respiratory: Normal respiratory effort, no problems with respiration noted  Abdomen: Soft, gravid, appropriate for gestational age.        Pelvic: Cervical exam deferred        Extremities: Normal range of motion.     Mental Status: Normal mood and affect. Normal behavior. Normal judgment and thought content.   Assessment and Plan:  Pregnancy: [id number] 163w0d.  Anatomy USKoreaoday (see below) 2.  AFP today (pt has lab slip and wants to come tomorrow morning) 3.  Tylenol for headaches   Anatomy USKoreaesults This patient was seen for a detailed fetal anatomy scan due  to a fibroid uterus.  She denies any significant past medical history and denies  any problems in her current pregnancy.  She had a cell free DNA test earlier in her pregnancy which  indicated a low risk for trisomy [id number] 13. A female fetus  is predicted.  She was informed that the fetal growth and amniotic fluid  level were appropriate for her gestational age.  There were no obvious fetal anomalies noted on today's  ultrasound exam.  However, today's exam was  limited due to  the fetal position.  The patient was informed that anomalies may be missed due  to technical limitations. If the fetus is in a suboptimal position  or maternal habitus is increased, visualization of the fetus in  the maternal uterus may be impaired.  A 1 to 2 cm posterior fibroid was noted in her uterus today.  The increased risk of maternal pain issues and fetal growth  issues later in her pregnancy due to the fibroids was  discussed.  A follow-up exam was scheduled in 4 weeks to assess the  fetal growth and to complete the views of the fetal anatomy. Preterm labor symptoms and general obstetric precautions including but not limited to vaginal bleeding, contractions, leaking of fluid and fetal movement were reviewed in detail with the patient. Please refer to After Visit Summary for other counseling recommendations.   No follow-ups on file.  Future Appointments  Date Time Provider DeValencia7/17/2023  7:45 AM [facility] NURSE [facility]/2023  8:00 AM [facility] US1 [facility]    KeSilas SacramentoMD

## 2021-12-30 NOTE — Progress Notes (Signed)
   PRENATAL VISIT NOTE  Subjective:  Bethany Odonnell is a 36 y.o. G9Q1194 at 71w3dbeing seen today for ongoing prenatal care.  She is currently monitored for the following issues for this low-risk pregnancy and has GBS bacteriuria and Supervision of other normal pregnancy, antepartum on their problem list.  Patient reports no complaints.  Contractions: Not present. Vag. Bleeding: None.  Movement: Present. Denies leaking of fluid.   The following portions of the patient's history were reviewed and updated as appropriate: allergies, current medications, past family history, past medical history, past social history, past surgical history and problem list.   Objective:   Vitals:   01/03/22 1617  BP: 118/74  Pulse: 97  Weight: 167 lb (75.8 kg)    Fetal Status: Fetal Heart Rate (bpm): 150 Fundal Height: 23 cm Movement: Present     General:  Alert, oriented and cooperative. Patient is in no acute distress.  Skin: Skin is warm and dry. No rash noted.   Cardiovascular: Normal heart rate noted  Respiratory: Normal respiratory effort, no problems with respiration noted  Abdomen: Soft, gravid, appropriate for gestational age.  Pain/Pressure: Absent     Pelvic: Cervical exam deferred        Extremities: Normal range of motion.  Edema: None  Mental Status: Normal mood and affect. Normal behavior. Normal judgment and thought content.   Assessment and Plan:  Pregnancy: [id number] 211w3d. Supervision of other normal pregnancy, antepartum Anatomy wnl Discussed birth control: She declines Reviewed AFP - she decided against it since US[place]oreaas normal.   2. GBS bacteriuria PCN in labor  Preterm labor symptoms and general obstetric precautions including but not limited to vaginal bleeding, contractions, leaking of fluid and fetal movement were reviewed in detail with the patient. Please refer to After Visit Summary for other counseling recommendations.   Return in about 4 weeks (around 01/31/2022)  for 2 hr GTT.  Future Appointments  Date Time Provider Department Center  02/05/2022  8:10 AM BhJulianne HandlerCNM [facility]    PaRadene GunningMD

## 2021-12-31 ENCOUNTER — Ambulatory Visit: Payer: 59 | Attending: Obstetrics

## 2021-12-31 ENCOUNTER — Encounter: Payer: Self-pay | Admitting: *Deleted

## 2021-12-31 ENCOUNTER — Ambulatory Visit: Payer: 59 | Admitting: *Deleted

## 2021-12-31 VITALS — BP 124/63 | HR 87

## 2021-12-31 DIAGNOSIS — O3410 Maternal care for benign tumor of corpus uteri, unspecified trimester: Secondary | ICD-10-CM | POA: Diagnosis present

## 2021-12-31 DIAGNOSIS — Z362 Encounter for other antenatal screening follow-up: Secondary | ICD-10-CM | POA: Insufficient documentation

## 2021-12-31 DIAGNOSIS — O358XX0 Maternal care for other (suspected) fetal abnormality and damage, not applicable or unspecified: Secondary | ICD-10-CM | POA: Diagnosis not present

## 2021-12-31 DIAGNOSIS — O3412 Maternal care for benign tumor of corpus uteri, second trimester: Secondary | ICD-10-CM | POA: Diagnosis not present

## 2021-12-31 DIAGNOSIS — D259 Leiomyoma of uterus, unspecified: Secondary | ICD-10-CM

## 2021-12-31 DIAGNOSIS — Z3A23 23 weeks gestation of pregnancy: Secondary | ICD-10-CM | POA: Diagnosis not present

## 2022-01-03 ENCOUNTER — Ambulatory Visit (INDEPENDENT_AMBULATORY_CARE_PROVIDER_SITE_OTHER): Payer: 59 | Admitting: Obstetrics and Gynecology

## 2022-01-03 VITALS — BP 118/74 | HR 97 | Wt 167.0 lb

## 2022-01-03 DIAGNOSIS — R8271 Bacteriuria: Secondary | ICD-10-CM

## 2022-01-03 DIAGNOSIS — Z3482 Encounter for supervision of other normal pregnancy, second trimester: Secondary | ICD-10-CM

## 2022-01-03 DIAGNOSIS — Z3A23 23 weeks gestation of pregnancy: Secondary | ICD-10-CM

## 2022-01-03 DIAGNOSIS — Z3480 Encounter for supervision of other normal pregnancy, unspecified trimester: Secondary | ICD-10-CM

## 2022-02-05 ENCOUNTER — Encounter: Payer: 59 | Admitting: Certified Nurse Midwife

## 2022-02-12 ENCOUNTER — Ambulatory Visit (INDEPENDENT_AMBULATORY_CARE_PROVIDER_SITE_OTHER): Payer: 59 | Admitting: Obstetrics and Gynecology

## 2022-02-12 VITALS — BP 107/71 | HR 79 | Wt 170.0 lb

## 2022-02-12 DIAGNOSIS — Z3483 Encounter for supervision of other normal pregnancy, third trimester: Secondary | ICD-10-CM

## 2022-02-12 DIAGNOSIS — Z3A29 29 weeks gestation of pregnancy: Secondary | ICD-10-CM | POA: Diagnosis not present

## 2022-02-12 DIAGNOSIS — Z3480 Encounter for supervision of other normal pregnancy, unspecified trimester: Secondary | ICD-10-CM

## 2022-02-12 NOTE — Progress Notes (Signed)
   PRENATAL VISIT NOTE  Subjective:  Bethany Odonnell is a 36 y.o. K9V7473 at 53w1dbeing seen today for ongoing prenatal care.  She is currently monitored for the following issues for this low-risk pregnancy and has GBS bacteriuria and Supervision of other normal pregnancy, antepartum on their problem list.  Patient reports  round ligament pain .  Contractions: Not present. Vag. Bleeding: None.  Movement: Present. Denies leaking of fluid.   The following portions of the patient's history were reviewed and updated as appropriate: allergies, current medications, past family history, past medical history, past social history, past surgical history and problem list.   Objective:   Vitals:   02/12/22 0827  BP: 107/71  Pulse: 79  Weight: 170 lb (77.1 kg)    Fetal Status: Fetal Heart Rate (bpm): 139 Fundal Height: 29 cm Movement: Present     General:  Alert, oriented and cooperative. Patient is in no acute distress.  Skin: Skin is warm and dry. No rash noted.   Cardiovascular: Normal heart rate noted  Respiratory: Normal respiratory effort, no problems with respiration noted  Abdomen: Soft, gravid, appropriate for gestational age.  Pain/Pressure: Absent     Pelvic: Cervical exam deferred        Extremities: Normal range of motion.  Edema: None  Mental Status: Normal mood and affect. Normal behavior. Normal judgment and thought content.   Assessment and Plan:  Pregnancy: [id number] 217w1d. Supervision of other normal pregnancy, antepartum  - 2Hr GTT w/ 1 Hr Carpenter 75 g - CBC - HIV antibody (with reflex) - RPR - Declined TDAP today. - Doing well, recommend pregnancy support belt.   Preterm labor symptoms and general obstetric precautions including but not limited to vaginal bleeding, contractions, leaking of fluid and fetal movement were reviewed in detail with the patient. Please refer to After Visit Summary for other counseling recommendations.   No follow-ups on  file.  Future Appointments  Date Time Provider DeGarrett8/29/2023  9:10 AM Williemae Muriel, JeArtist PaisNP [facility]  JeNoni SaupeNP

## 2022-02-12 NOTE — Progress Notes (Signed)
Pt is deferring Tdap until next visit

## 2022-02-12 NOTE — Progress Notes (Signed)
Deferring Tdap this visit

## 2022-02-13 LAB — RPR: RPR Ser Ql: NONREACTIVE

## 2022-02-13 LAB — CBC
HCT: 31.9 % — ABNORMAL LOW (ref 35.0–45.0)
Hemoglobin: 10.6 g/dL — ABNORMAL LOW (ref 11.7–15.5)
MCH: 29.9 pg (ref 27.0–33.0)
MCHC: 33.2 g/dL (ref 32.0–36.0)
MCV: 90.1 fL (ref 80.0–100.0)
MPV: 11.8 fL (ref 7.5–12.5)
Platelets: 222 10*3/uL (ref 140–400)
RBC: 3.54 10*6/uL — ABNORMAL LOW (ref 3.80–5.10)
RDW: 12.9 % (ref 11.0–15.0)
WBC: 9.2 10*3/uL (ref 3.8–10.8)

## 2022-02-13 LAB — 2HR GTT W 1 HR, CARPENTER, 75 G
Glucose, 1 Hr, Gest: 96 mg/dL (ref 65–179)
Glucose, 2 Hr, Gest: 88 mg/dL (ref 65–152)
Glucose, Fasting, Gest: 70 mg/dL (ref 65–91)

## 2022-02-13 LAB — HIV ANTIBODY (ROUTINE TESTING W REFLEX): HIV 1&2 Ab, 4th Generation: NONREACTIVE

## 2022-03-05 ENCOUNTER — Ambulatory Visit (INDEPENDENT_AMBULATORY_CARE_PROVIDER_SITE_OTHER): Payer: 59

## 2022-03-05 VITALS — BP 117/73 | HR 97 | Wt 173.0 lb

## 2022-03-05 DIAGNOSIS — Z3480 Encounter for supervision of other normal pregnancy, unspecified trimester: Secondary | ICD-10-CM

## 2022-03-05 DIAGNOSIS — Z3A32 32 weeks gestation of pregnancy: Secondary | ICD-10-CM

## 2022-03-05 DIAGNOSIS — R102 Pelvic and perineal pain: Secondary | ICD-10-CM

## 2022-03-05 DIAGNOSIS — Z23 Encounter for immunization: Secondary | ICD-10-CM

## 2022-03-05 DIAGNOSIS — O26893 Other specified pregnancy related conditions, third trimester: Secondary | ICD-10-CM

## 2022-03-05 NOTE — Progress Notes (Signed)
Tdap given L Del without difficulty  Pt requests to defer flu vaccine until next visit.

## 2022-03-05 NOTE — Progress Notes (Signed)
   PRENATAL VISIT NOTE  Subjective:  Bethany Odonnell is a 36 y.o. [id number] at 61w1dbeing seen today for ongoing prenatal care.  She is currently monitored for the following issues for this low-risk pregnancy and has GBS bacteriuria and Supervision of other normal pregnancy, antepartum on their problem list.  Patient reports pubic symphysis pain x1 month. Pain is constant, but worsened with movement and with sitting too long. She is also reporting some intermittent urinary incontinence in which she is having to wear pantyliners for. She has tried Tylenol, heat, etc without relief. She is looking into support belts but has not yet purchased one.  Contractions: Irritability. Vag. Bleeding: None.  Movement: Present. Denies leaking of fluid.   The following portions of the patient's history were reviewed and updated as appropriate: allergies, current medications, past family history, past medical history, past social history, past surgical history and problem list.   Objective:   Vitals:   03/05/22 1128  BP: 117/73  Pulse: 97  Weight: 173 lb (78.5 kg)    Fetal Status: Fetal Heart Rate (bpm): 142 Fundal Height: 32 cm Movement: Present     General:  Alert, oriented and cooperative. Patient is in no acute distress.  Skin: Skin is warm and dry. No rash noted.   Cardiovascular: Normal heart rate noted  Respiratory: Normal respiratory effort, no problems with respiration noted  Abdomen: Soft, gravid, appropriate for gestational age.  Pain/Pressure: Present     Pelvic: Cervical exam deferred        Extremities: Normal range of motion.  Edema: Trace  Mental Status: Normal mood and affect. Normal behavior. Normal judgment and thought content.   Assessment and Plan:  Pregnancy: [id number] 372w1d. Supervision of other normal pregnancy, antepartum - Routine OB - Anticipatory guidance for upcoming appointments provided  - Tdap vaccine greater than or equal to 7yo IM  2. Pelvic pain affecting  pregnancy in third trimester, antepartum - Significant amount of tenderness at the pubic symphysis on exam. Given this and urinary incontinence, will place referral to pelvic floor PT - Patient encouraged to continue using heat/Tylenol/support belt or athletic tape  - Ambulatory referral to Physical Therapy  3. [redacted] weeks gestation of pregnancy - FH appropriate - Endorses normal active fetal movement   Preterm labor symptoms and general obstetric precautions including but not limited to vaginal bleeding, contractions, leaking of fluid and fetal movement were reviewed in detail with the patient. Please refer to After Visit Summary for other counseling recommendations.   Return in about 2 weeks (around 03/19/2022) for LOB.  Future Appointments  Date Time Provider [place]10/08/2021 10:10 AM SiRenee HarderCNM [facility]10/03/2022 10:30 AM SiRenee HarderCNM [facility]  04/02/2022 10:10 AM SiRenee HarderCNM [facility]/24/2023 10:10 AM Rasch, JeArtist PaisNP [facility]10/31/2023 10:10 AM Rasch, JeArtist PaisNP [facility]    DaRenee HarderCNM

## 2022-03-19 ENCOUNTER — Ambulatory Visit (INDEPENDENT_AMBULATORY_CARE_PROVIDER_SITE_OTHER): Payer: 59

## 2022-03-19 VITALS — BP 110/68 | HR 82 | Wt 174.0 lb

## 2022-03-19 DIAGNOSIS — O26893 Other specified pregnancy related conditions, third trimester: Secondary | ICD-10-CM

## 2022-03-19 DIAGNOSIS — Z3480 Encounter for supervision of other normal pregnancy, unspecified trimester: Secondary | ICD-10-CM

## 2022-03-19 DIAGNOSIS — Z3A34 34 weeks gestation of pregnancy: Secondary | ICD-10-CM

## 2022-03-19 DIAGNOSIS — R102 Pelvic and perineal pain: Secondary | ICD-10-CM

## 2022-03-19 DIAGNOSIS — R12 Heartburn: Secondary | ICD-10-CM

## 2022-03-19 DIAGNOSIS — Z3483 Encounter for supervision of other normal pregnancy, third trimester: Secondary | ICD-10-CM

## 2022-03-19 DIAGNOSIS — R8271 Bacteriuria: Secondary | ICD-10-CM

## 2022-03-19 MED ORDER — FAMOTIDINE 20 MG PO TABS: 20.0000 mg | ORAL_TABLET | Freq: Two times a day (BID) | ORAL | 3 refills | Status: DC

## 2022-03-19 NOTE — Progress Notes (Signed)
Pt declined flu shot °

## 2022-03-19 NOTE — Progress Notes (Signed)
   PRENATAL VISIT NOTE  Subjective:  Bethany Odonnell is a 36 y.o. [id number] at 23w1dbeing seen today for ongoing prenatal care.  She is currently monitored for the following issues for this low-risk pregnancy and has GBS bacteriuria and Supervision of other normal pregnancy, antepartum on their problem list.  Patient reports heartburn and ongoing pelvic pain. Taking Tums for heartburn, but requesting something else. Contractions: Irritability. Vag. Bleeding: None.  Movement: Present. Denies leaking of fluid.   The following portions of the patient's history were reviewed and updated as appropriate: allergies, current medications, past family history, past medical history, past social history, past surgical history and problem list.   Objective:   Vitals:   03/19/22 1004  BP: 110/68  Pulse: 82  Weight: 174 lb (78.9 kg)    Fetal Status: Fetal Heart Rate (bpm): 136 Fundal Height: 34 cm Movement: Present     General:  Alert, oriented and cooperative. Patient is in no acute distress.  Skin: Skin is warm and dry. No rash noted.   Cardiovascular: Normal heart rate noted  Respiratory: Normal respiratory effort, no problems with respiration noted  Abdomen: Soft, gravid, appropriate for gestational age.  Pain/Pressure: Present     Pelvic: Cervical exam deferred        Extremities: Normal range of motion.  Edema: Trace  Mental Status: Normal mood and affect. Normal behavior. Normal judgment and thought content.   Assessment and Plan:  Pregnancy: [id number] 3108w1d. Supervision of other normal pregnancy, antepartum - Routine OB - GC/CT next visit - Anticipatory guidance for upcoming appointments provided  2. [redacted] weeks gestation of pregnancy - FH appropriate - Endorses active fetal movement  3. GBS bacteriuria - PCN in labor  4. Heartburn during pregnancy in third trimester - Rx for Pepcid - Reviewed dietary modifications, sitting up for 30 minutes after eating, small/frequent meals and  snacks  - famotidine (PEPCID) 20 MG tablet; Take 1 tablet (20 mg total) by mouth 2 (two) times daily.  Dispense: 60 tablet; Refill: 3  5. Pelvic pain affecting pregnancy in third trimester, antepartum - Reports office called, but still needs to schedule appt   Preterm labor symptoms and general obstetric precautions including but not limited to vaginal bleeding, contractions, leaking of fluid and fetal movement were reviewed in detail with the patient. Please refer to After Visit Summary for other counseling recommendations.   Return in about 2 weeks (around 04/02/2022) for LOB.  Future Appointments  Date Time Provider [place]10/03/2022 10:30 AM SiRenee HarderCNM [facility]/17/2023 10:10 AM SiRenee HarderCNM [facility]/24/2023 10:10 AM Rasch, JeArtist PaisNP [facility]/31/2023 10:10 AM Rasch, JeArtist PaisNP [facility]    DaRenee HarderCNM

## 2022-04-02 ENCOUNTER — Other Ambulatory Visit (HOSPITAL_COMMUNITY)
Admission: RE | Admit: 2022-04-02 | Discharge: 2022-04-02 | Disposition: A | Discharge status: Home / Self Care | Payer: 59 | Source: Ambulatory Visit

## 2022-04-02 ENCOUNTER — Ambulatory Visit (INDEPENDENT_AMBULATORY_CARE_PROVIDER_SITE_OTHER): Payer: 59

## 2022-04-02 VITALS — BP 113/70 | HR 88 | Wt 175.0 lb

## 2022-04-02 DIAGNOSIS — Z3480 Encounter for supervision of other normal pregnancy, unspecified trimester: Secondary | ICD-10-CM | POA: Diagnosis present

## 2022-04-02 DIAGNOSIS — Z3A36 36 weeks gestation of pregnancy: Secondary | ICD-10-CM | POA: Diagnosis not present

## 2022-04-02 DIAGNOSIS — Z3483 Encounter for supervision of other normal pregnancy, third trimester: Secondary | ICD-10-CM

## 2022-04-02 DIAGNOSIS — R8271 Bacteriuria: Secondary | ICD-10-CM

## 2022-04-02 NOTE — Progress Notes (Signed)
   PRENATAL VISIT NOTE  Subjective:  Bethany Odonnell is a 36 y.o. [id number] at 68w1dbeing seen today for ongoing prenatal care.  She is currently monitored for the following issues for this low-risk pregnancy and has GBS bacteriuria and Supervision of other normal pregnancy, antepartum on their problem list.  Patient reports heartburn.  Contractions: Irritability. Vag. Bleeding: None.  Movement: Present. Denies leaking of fluid.   The following portions of the patient's history were reviewed and updated as appropriate: allergies, current medications, past family history, past medical history, past social history, past surgical history and problem list.   Objective:   Vitals:   04/02/22 1014  BP: 113/70  Pulse: 88  Weight: 175 lb (79.4 kg)    Fetal Status: Fetal Heart Rate (bpm): 140 Fundal Height: 36 cm Movement: Present     General:  Alert, oriented and cooperative. Patient is in no acute distress.  Skin: Skin is warm and dry. No rash noted.   Cardiovascular: Normal heart rate noted  Respiratory: Normal respiratory effort, no problems with respiration noted  Abdomen: Soft, gravid, appropriate for gestational age.  Pain/Pressure: Present     Pelvic: Cervical exam deferred        Extremities: Normal range of motion.  Edema: Trace  Mental Status: Normal mood and affect. Normal behavior. Normal judgment and thought content.   Assessment and Plan:  Pregnancy: [id number] 345w1d. Supervision of other normal pregnancy, antepartum - Routine OB. Doing well.  - Reports she turned over in bed and felt pelvic bone "pop". Pain is much more manageable and can walk without difficulty, however still having urinary incontinence. She has PT referral, however has not made appt due to work schedule. She is planning to make appt soon - Continued heartburn but has not taken Pepcid as rx'd - Vertex via [facility]  - [facility]. [redacted] weeks gestation of pregnancy -  FH appropriate - Endorses active fetal movement  3. GBS bacteriuria - PCN in labor   Preterm labor symptoms and general obstetric precautions including but not limited to vaginal bleeding, contractions, leaking of fluid and fetal movement were reviewed in detail with the patient. Please refer to After Visit Summary for other counseling recommendations.   No follow-ups on file.  Future Appointments  Date Time Provider [place]10/24/2023 10:10 AM Rasch, JeArtist PaisNP [facility]10/31/2023 10:10 AM Rasch, JeArtist PaisNP [facility]11/12/2021 10:10 AM SiRenee HarderCNM [facility]    DaRenee HarderCNM

## 2022-04-03 LAB — GC/CHLAMYDIA PROBE AMP (~~LOC~~) NOT AT ARMC
Chlamydia: NEGATIVE
Comment: NEGATIVE
Comment: NORMAL
Neisseria Gonorrhea: NEGATIVE

## 2022-04-09 ENCOUNTER — Ambulatory Visit (INDEPENDENT_AMBULATORY_CARE_PROVIDER_SITE_OTHER): Payer: 59 | Admitting: Obstetrics and Gynecology

## 2022-04-09 VITALS — BP 110/69 | HR 86 | Wt 177.0 lb

## 2022-04-09 DIAGNOSIS — R8271 Bacteriuria: Secondary | ICD-10-CM

## 2022-04-09 DIAGNOSIS — Z3483 Encounter for supervision of other normal pregnancy, third trimester: Secondary | ICD-10-CM

## 2022-04-09 DIAGNOSIS — Z3A37 37 weeks gestation of pregnancy: Secondary | ICD-10-CM

## 2022-04-09 DIAGNOSIS — Z3480 Encounter for supervision of other normal pregnancy, unspecified trimester: Secondary | ICD-10-CM

## 2022-04-09 NOTE — Progress Notes (Signed)
   PRENATAL VISIT NOTE  Subjective:  Bethany Odonnell is a 36 y.o. [id number] at 78w1dbeing seen today for ongoing prenatal care.  She is currently monitored for the following issues for this low-risk pregnancy and has GBS bacteriuria and Supervision of other normal pregnancy, antepartum on their problem list.  Patient reports no complaints.  Contractions: Irritability. Vag. Bleeding: None.  Movement: Present. Denies leaking of fluid.   The following portions of the patient's history were reviewed and updated as appropriate: allergies, current medications, past family history, past medical history, past social history, past surgical history and problem list.   Objective:   Vitals:   04/09/22 1008  BP: 110/69  Pulse: 86  Weight: 177 lb (80.3 kg)    Fetal Status: Fetal Heart Rate (bpm): 156   Movement: Present  Presentation: Vertex  General:  Alert, oriented and cooperative. Patient is in no acute distress.  Skin: Skin is warm and dry. No rash noted.   Cardiovascular: Normal heart rate noted  Respiratory: Normal respiratory effort, no problems with respiration noted  Abdomen: Soft, gravid, appropriate for gestational age.  Pain/Pressure: Present     Pelvic: Cervical exam performed in the presence of a chaperone Dilation: 1.5 Effacement (%): Thick Station: -3  Extremities: Normal range of motion.  Edema: Trace  Mental Status: Normal mood and affect. Normal behavior. Normal judgment and thought content.   Assessment and Plan:  Pregnancy: [id number] 351w1d1. Supervision of other normal pregnancy, antepartum  Doing well Good fetal movement BP good.   2. GBS bacteriuria  Will treat in labor. No allergies.    Term labor symptoms and general obstetric precautions including but not limited to vaginal bleeding, contractions, leaking of fluid and fetal movement were reviewed in detail with the patient. Please refer to After Visit Summary for other counseling recommendations.   No  follow-ups on file.  Future Appointments  Date Time Provider [place]10/31/2023 10:10 AM Sabreena Vogan, JeArtist PaisNP [facility]11/12/2021 10:10 AM SiRenee HarderCNM [facility]    JeNoni SaupeNP

## 2022-04-16 ENCOUNTER — Ambulatory Visit (INDEPENDENT_AMBULATORY_CARE_PROVIDER_SITE_OTHER): Payer: 59 | Admitting: Obstetrics and Gynecology

## 2022-04-16 VITALS — BP 136/77 | HR 89 | Wt 179.0 lb

## 2022-04-16 DIAGNOSIS — Z3A38 38 weeks gestation of pregnancy: Secondary | ICD-10-CM

## 2022-04-16 DIAGNOSIS — Z3483 Encounter for supervision of other normal pregnancy, third trimester: Secondary | ICD-10-CM

## 2022-04-16 DIAGNOSIS — R8271 Bacteriuria: Secondary | ICD-10-CM

## 2022-04-16 DIAGNOSIS — Z3480 Encounter for supervision of other normal pregnancy, unspecified trimester: Secondary | ICD-10-CM

## 2022-04-16 NOTE — Progress Notes (Signed)
   PRENATAL VISIT NOTE  Subjective:  Bethany Odonnell is a 36 y.o. [id number] at 84w1dbeing seen today for ongoing prenatal care.  She is currently monitored for the following issues for this low-risk pregnancy and has GBS bacteriuria and Supervision of other normal pregnancy, antepartum on their problem list.  Patient reports no complaints.  Contractions: Irritability. Vag. Bleeding: None.  Movement: Present. Denies leaking of fluid.   The following portions of the patient's history were reviewed and updated as appropriate: allergies, current medications, past family history, past medical history, past social history, past surgical history and problem list.   Objective:   Vitals:   04/16/22 1016  BP: 136/77  Pulse: 89  Weight: 179 lb (81.2 kg)    Fetal Status: Fetal Heart Rate (bpm): 146 Fundal Height: 38 cm Movement: Present  Presentation: Vertex  General:  Alert, oriented and cooperative. Patient is in no acute distress.  Skin: Skin is warm and dry. No rash noted.   Cardiovascular: Normal heart rate noted  Respiratory: Normal respiratory effort, no problems with respiration noted  Abdomen: Soft, gravid, appropriate for gestational age.  Pain/Pressure: Present     Pelvic: Cervical exam performed in the presence of a chaperone Dilation: 1.5 Effacement (%): 60, 70 Station: -3  Extremities: Normal range of motion.  Edema: Trace  Mental Status: Normal mood and affect. Normal behavior. Normal judgment and thought content.   Assessment and Plan:  Pregnancy: [id number] 366w1d 1. Supervision of other normal pregnancy, antepartum  Doing great BP WnL   2. GBS bacteriuria  Will treat in labor  Term labor symptoms and general obstetric precautions including but not limited to vaginal bleeding, contractions, leaking of fluid and fetal movement were reviewed in detail with the patient. Please refer to After Visit Summary for other counseling recommendations.   No follow-ups on  file.  Future Appointments  Date Time Provider [place]11/12/2021 10:10 AM SiRenee HarderCNM [facility]    JeNoni SaupeNP

## 2022-04-21 ENCOUNTER — Inpatient Hospital Stay (HOSPITAL_COMMUNITY): Payer: 59 | Admitting: Anesthesiology

## 2022-04-21 ENCOUNTER — Other Ambulatory Visit: Payer: Self-pay

## 2022-04-21 ENCOUNTER — Encounter (HOSPITAL_COMMUNITY): Payer: Self-pay | Admitting: Obstetrics and Gynecology

## 2022-04-21 ENCOUNTER — Inpatient Hospital Stay (HOSPITAL_COMMUNITY)
Admission: AD | Admit: 2022-04-21 | Discharge: 2022-04-23 | DRG: 807 | Disposition: A | Discharge status: Home / Self Care | Payer: 59 | Attending: Obstetrics & Gynecology | Admitting: Obstetrics & Gynecology

## 2022-04-21 DIAGNOSIS — Z3A38 38 weeks gestation of pregnancy: Secondary | ICD-10-CM

## 2022-04-21 DIAGNOSIS — Z3480 Encounter for supervision of other normal pregnancy, unspecified trimester: Principal | ICD-10-CM

## 2022-04-21 DIAGNOSIS — O99824 Streptococcus B carrier state complicating childbirth: Secondary | ICD-10-CM | POA: Diagnosis present

## 2022-04-21 DIAGNOSIS — R8271 Bacteriuria: Secondary | ICD-10-CM | POA: Diagnosis present

## 2022-04-21 LAB — CBC
HCT: 32.1 % — ABNORMAL LOW (ref 36.0–46.0)
Hemoglobin: 10.2 g/dL — ABNORMAL LOW (ref 12.0–15.0)
MCH: 27.2 pg (ref 26.0–34.0)
MCHC: 31.8 g/dL (ref 30.0–36.0)
MCV: 85.6 fL (ref 80.0–100.0)
Platelets: 210 10*3/uL (ref 150–400)
RBC: 3.75 MIL/uL — ABNORMAL LOW (ref 3.87–5.11)
RDW: 14.5 % (ref 11.5–15.5)
WBC: 11.5 10*3/uL — ABNORMAL HIGH (ref 4.0–10.5)
nRBC: 0 % (ref 0.0–0.2)

## 2022-04-21 LAB — TYPE AND SCREEN
ABO/RH(D): O POS
Antibody Screen: NEGATIVE

## 2022-04-21 MED ORDER — DIPHENHYDRAMINE HCL 50 MG/ML IJ SOLN: 12.5000 mg | INTRAMUSCULAR | Status: DC | PRN

## 2022-04-21 MED ORDER — EPHEDRINE 5 MG/ML INJ
10.0000 mg | INTRAVENOUS | Status: DC | PRN
  Filled 2022-04-21: qty 5

## 2022-04-21 MED ORDER — OXYTOCIN BOLUS FROM INFUSION
333.0000 mL | Freq: Once | INTRAVENOUS | Status: AC
  Administered 2022-04-21: 333 mL via INTRAVENOUS

## 2022-04-21 MED ORDER — PENICILLIN G POTASSIUM 5000000 UNITS IJ SOLR: 5.0000 10*6.[IU] | INTRAMUSCULAR | Status: DC

## 2022-04-21 MED ORDER — ONDANSETRON HCL 4 MG/2ML IJ SOLN
4.0000 mg | Freq: Four times a day (QID) | INTRAMUSCULAR | Status: DC | PRN
  Administered 2022-04-21: 4 mg via INTRAVENOUS
  Filled 2022-04-21: qty 2

## 2022-04-21 MED ORDER — SOD CITRATE-CITRIC ACID 500-334 MG/5ML PO SOLN
30.0000 mL | ORAL | Status: DC | PRN
  Administered 2022-04-21: 30 mL via ORAL
  Filled 2022-04-21: qty 30

## 2022-04-21 MED ORDER — LACTATED RINGERS IV SOLN: INTRAVENOUS | Status: DC

## 2022-04-21 MED ORDER — LACTATED RINGERS IV SOLN
500.0000 mL | Freq: Once | INTRAVENOUS | Status: AC
  Administered 2022-04-21: 500 mL via INTRAVENOUS

## 2022-04-21 MED ORDER — OXYTOCIN-SODIUM CHLORIDE 30-0.9 UT/500ML-% IV SOLN
2.5000 [IU]/h | INTRAVENOUS | Status: DC
  Administered 2022-04-21: 2.5 [IU]/h via INTRAVENOUS
  Filled 2022-04-21: qty 500

## 2022-04-21 MED ORDER — LACTATED RINGERS IV SOLN
500.0000 mL | INTRAVENOUS | Status: DC | PRN
  Administered 2022-04-21 (×2): 500 mL via INTRAVENOUS

## 2022-04-21 MED ORDER — FENTANYL-BUPIVACAINE-NACL 0.5-0.125-0.9 MG/250ML-% EP SOLN
12.0000 mL/h | EPIDURAL | Status: DC | PRN
  Administered 2022-04-21: 12 mL/h via EPIDURAL
  Filled 2022-04-21: qty 250

## 2022-04-21 MED ORDER — PENICILLIN G POT IN DEXTROSE 60000 UNIT/ML IV SOLN
3.0000 10*6.[IU] | INTRAVENOUS | Status: DC
  Administered 2022-04-21: 3 10*6.[IU] via INTRAVENOUS
  Filled 2022-04-21: qty 50

## 2022-04-21 MED ORDER — SODIUM CHLORIDE 0.9 % IV SOLN
5.0000 10*6.[IU] | Freq: Once | INTRAVENOUS | Status: AC
  Administered 2022-04-21: 5 10*6.[IU] via INTRAVENOUS
  Filled 2022-04-21: qty 5

## 2022-04-21 MED ORDER — PHENYLEPHRINE 80 MCG/ML (10ML) SYRINGE FOR IV PUSH (FOR BLOOD PRESSURE SUPPORT): 80.0000 ug | PREFILLED_SYRINGE | INTRAVENOUS | Status: DC | PRN

## 2022-04-21 MED ORDER — OXYCODONE-ACETAMINOPHEN 5-325 MG PO TABS: 2.0000 | ORAL_TABLET | ORAL | Status: DC | PRN

## 2022-04-21 MED ORDER — LIDOCAINE-EPINEPHRINE (PF) 2 %-1:200000 IJ SOLN
INTRAMUSCULAR | Status: DC | PRN
  Administered 2022-04-21: 5 mL via EPIDURAL

## 2022-04-21 MED ORDER — LIDOCAINE HCL (PF) 1 % IJ SOLN: 30.0000 mL | INTRAMUSCULAR | Status: DC | PRN

## 2022-04-21 MED ORDER — ACETAMINOPHEN 325 MG PO TABS: 650.0000 mg | ORAL_TABLET | ORAL | Status: DC | PRN

## 2022-04-21 MED ORDER — CALCIUM CARBONATE ANTACID 500 MG PO CHEW: 1.0000 | CHEWABLE_TABLET | Freq: Once | ORAL | Status: DC

## 2022-04-21 MED ORDER — PHENYLEPHRINE 80 MCG/ML (10ML) SYRINGE FOR IV PUSH (FOR BLOOD PRESSURE SUPPORT)
80.0000 ug | PREFILLED_SYRINGE | INTRAVENOUS | Status: DC | PRN
  Filled 2022-04-21: qty 10

## 2022-04-21 MED ORDER — OXYCODONE-ACETAMINOPHEN 5-325 MG PO TABS: 1.0000 | ORAL_TABLET | ORAL | Status: DC | PRN

## 2022-04-21 MED ORDER — EPHEDRINE 5 MG/ML INJ: 10.0000 mg | INTRAVENOUS | Status: DC | PRN

## 2022-04-21 NOTE — Lactation Note (Signed)
This note was copied from a baby's chart. Lactation Consultation Note  Patient Name: Bethany Odonnell CNOBS'J Date: 04/21/2022 Reason for consult: L&D Initial assessment;Early term 37-38.6wks Age:34 hours Mom trying to latch baby. Baby off and on. Will not stay latched. Mom stated baby sucks and stops. Newborn behavior and feeding habits reviewed. Mom hand expresses colostrum before latching baby. Baby latching. Mom keeps pulling back on breast tissue worrying about baby being able to breathe. It caused unlatching or shallow latch. Encouraged not to pul back put push towards.baby was in football position when [facility] arrived. RN had set up positioning and latching. Done a good job. Mom will be f/u on MBU. Mom is very sleepy. Suggested dad to monitor mom for safety while BF. Maternal Data    Feeding    LATCH Score Latch: Repeated attempts needed to sustain latch, nipple held in mouth throughout feeding, stimulation needed to elicit sucking reflex.  Audible Swallowing: A few with stimulation  Type of Nipple: Everted at rest and after stimulation (very short compressible shaft)  Comfort (Breast/Nipple): Soft / non-tender  Hold (Positioning): Assistance needed to correctly position infant at breast and maintain latch.  LATCH Score: 7   Lactation Tools Discussed/Used    Interventions Interventions: Adjust position;Assisted with latch;Support pillows;Skin to skin;Breast compression;Hand express  Discharge    Consult Status Consult Status: Follow-up from L&D Date: 04/22/22 Follow-up type: In-patient    Theodoro Kalata 04/21/2022, 11:01 PM

## 2022-04-21 NOTE — Plan of Care (Signed)

## 2022-04-21 NOTE — Progress Notes (Signed)
Labor Progress Note Shatisha Falter is a 36 y.o. [id number] at 65w6dpresented for latent labor and NRNST.  S: No acute concerns.   O:  BP (!) 94/51   Pulse 70   Temp 98.2 F (36.8 C) (Oral)   Resp 16   Ht '5\' 7"'$  (1.702 m)   Wt 80.7 kg   LMP 07/23/2021   SpO2 100%   BMI 27.88 kg/m  EFM: 150 bpm/moderate/+accels, no decels  CVE: Dilation: 4 Effacement (%): 90 Station: -2 Presentation: Vertex Exam by:: Autry-Lott MD   A&P: 36y.o. [id number] for latent labor and NRNST. #Labor: Unchanged from last exam. AROM w/ clear fluid. Consider the need for pitocin.  #Pain: Epidural #FWB: Cat I  #GBS positive  Angelica Frandsen Autry-Lott, DO 6:47 PM

## 2022-04-21 NOTE — H&P (Signed)
OBSTETRIC ADMISSION HISTORY AND PHYSICAL  Bethany Odonnell is a 36 y.o. female [id number] with IUP at 35w6dby early [place] for SOL/latent labor and NRNST. She reports +FMs, No LOF, no VB, no blurry vision, headaches or peripheral edema, and RUQ pain.  She plans on breast feeding. She declines birth control [facility] [place]  Estimated Date of Delivery: 04/29/22  Sono:    '@[redacted]w[redacted]d'$ , CWD, normal anatomy, variable presentation, anterior placental lie, 539g, 35% EFW    Prenatal History/Complications:  -GBS+  Past Medical History: Past Medical History:  Diagnosis Date   Allergic rhinitis    Anxiety    GERD (gastroesophageal reflux disease)    IBS (irritable bowel syndrome)    Scoliosis     Past Surgical History: Past Surgical History:  Procedure Laterality Date   INDUCED ABORTION      Obstetrical History: OB History     Gravida  6   Para  2   Term  2   Preterm      AB  3   Living  2      SAB  2   IAB      Ectopic      Multiple  0   Live Births  2           Social History Social History   Socioeconomic History   Marital status: Single    Spouse name: CLysbeth Galas  Number of children: 1   Years of education: Not on file   Highest education level: Not on file  Occupational History   Occupation: unemployed    Comment: plans to go back to work after pregnancy  Tobacco Use   Smoking status: Never   Smokeless tobacco: Never  Vaping Use   Vaping Use: Never used  Substance and Sexual Activity   Alcohol use: No    Comment: occasionally on special occasion, however none while pregnant   Drug use: No   Sexual activity: Yes    Partners: Male    Birth control/protection: None  Other Topics Concern   Not on file  Social History Narrative   Lives with husband and 569year-old daughter in a condo. Feels safe and stable there. No other significant social history.   Social Determinants of Health    Financial Resource Strain: Not on file  Food Insecurity: Not on file  Transportation Needs: Not on file  Physical Activity: Not on file  Stress: Not on file  Social Connections: Not on file    Family History: Family History  Problem Relation Age of Onset   Alcohol abuse Father    Drug abuse Father    Birth defects Mother    Depression Mother    Miscarriages / [place]    Asthma Daughter    Breast cancer Maternal Grandmother    Lung cancer Maternal Grandfather    Allergic rhinitis Daughter     Allergies: No Known Allergies  Medications Prior to Admission  Medication Sig Dispense Refill Last Dose   calcium carbonate (TUMS - DOSED IN MG ELEMENTAL CALCIUM) 500 MG chewable tablet Chew 1 tablet by mouth 2 (two) times daily as needed for indigestion or heartburn.   04/20/2022   Prenatal Vit-Fe Fumarate-FA (PRENATAL VITAMINS PO) Take by mouth.   04/20/2022   Doxylamine-Pyridoxine (DICLEGIS) 10-10 MG TBEC Take 2 tabs at bedtime. If needed, add another tab in the morning. If needed, add another tab  in the afternoon, up to 4 tabs/day. (Patient not taking: Reported on 10/30/2021) 100 tablet 5    famotidine (PEPCID) 20 MG tablet Take 1 tablet (20 mg total) by mouth 2 (two) times daily. (Patient not taking: Reported on 04/02/2022) 60 tablet 3    metoCLOPramide (REGLAN) 10 MG tablet Take 1 tablet (10 mg total) by mouth 3 (three) times daily with meals as needed for nausea. (Patient not taking: Reported on 10/30/2021) 30 tablet 2    promethazine (PHENERGAN) 25 MG tablet Take 1 tablet (25 mg total) by mouth every 6 (six) hours as needed for nausea or vomiting. (Patient not taking: Reported on 10/02/2021) 30 tablet 1      Review of Systems   All systems reviewed and negative except as stated in HPI  Blood pressure (!) 98/48, pulse 72, temperature 98.4 F (36.9 C), temperature source Oral, resp. rate 16, last menstrual period 07/23/2021, SpO2 98 %, unknown if currently  breastfeeding. General appearance: alert and no distress Lungs:normal effort Heart: regular rate noted Abdomen: gravid, S<D Pelvic: See below Extremities: Homans sign is negative, no sign of DVT Presentation: cephalic per exam below Fetal monitoringBaseline: 135 bpm, Variability: Moderate with periods of minimal, Accelerations: Reactive, and Decelerations: Variable: mild Uterine activityFrequency: Every 2-4 minutes Dilation: 4 Effacement (%): 70 Station: -2 Exam by:: Dr. Janus Molder   Prenatal labs: ABO, Rh: O/RH(D) POSITIVE/-- (04/18 1141) Antibody: NO ANTIBODIES DETECTED (04/18 1141) Rubella: 1.09 (04/18 1141) RPR: NON-REACTIVE (08/29 0951)  HBsAg: NON-REACTIVE (04/18 1141)  HIV: NON-REACTIVE (08/29 0951)  GBS:    1 hr Glucola 96 Genetic screening  NIPS nml Anatomy US wnl  Prenatal Transfer Tool  Maternal Diabetes: No Genetic Screening: Normal Maternal Ultrasounds/Referrals: Normal Fetal Ultrasounds or other Referrals:  None Maternal Substance Abuse:  No Significant Maternal Medications:  None Significant Maternal Lab Results:  Group B Strep negative Number of Prenatal Visits:greater than 3 verified prenatal visits Other Comments:  None  No results found for this or any previous visit (from the past 24 hour(s)).  Patient Active Problem List   Diagnosis Date Noted   Supervision of other normal pregnancy, antepartum 10/01/2021   GBS bacteriuria 08/30/2021    Assessment/Plan:  Bethany Odonnell is a 36 y.o. [id number] at 58w6dhere for SOL/latent labor and NRNST (min to mod variability w/ occasional variable) in MAU today.  #Labor: Unchanged from last exam. Expectant management for now, planning to get an epidural now. Consider AROM after epidural. S<D, last [place] '@23wks'$  with 35% EFW. Consider avoiding pit at this time. CLD for now.   #Pain: Maternally supported, epidural now #FWB: Cat II given occasional variables and periods of minimal variability. Bolus x1. #ID:Bethany Odonnell  PCN ppx #MOF: Breast #MOC: Declined #Circ:  N/a, girl  Bethany Vought Autry-Lott, DO  04/21/2022, 1:40 PM

## 2022-04-21 NOTE — Progress Notes (Signed)
  S: Bethany Odonnell is a 36 y.o. [id number] at [redacted]w[redacted]d who presents to MAU today complaining contractions q 7  minutes since 6 AM. She denies vaginal bleeding. She denies LOF. She reports normal fetal movement.    O: BP (!) 98/48 (BP Location: Right Arm)   Pulse 72   Temp 98.4 F (36.9 C) (Oral)   Resp 16   LMP 07/23/2021   SpO2 98%  GENERAL: Well-developed, well-nourished female in no acute distress.  HEAD: Normocephalic, atraumatic.  CHEST: Normal effort of breathing, regular heart rate ABDOMEN: Soft, nontender, gravid  Cervical exam:  Dilation: 4 Effacement (%): 70 Station: -2 (bulging bag of water) Presentation: Vertex Exam by:: Dr. CCaron Presume  Fetal Monitoring: Baseline: 145 Variability: Moderate at this time with periods of minimal variability Accelerations: Present Decelerations: Occasional variable Contractions: Every 3 to 5 minutes   A: SIUP at 335w6dActive labor  P: Will admit to labor and delivery.  GBS positive, labor admission orders placed as well as penicillin orders placed.  Have notified labor and delivery team.  CrConcepcion LivingMD 04/21/2022 1:44 PM

## 2022-04-21 NOTE — Discharge Summary (Signed)
Postpartum Discharge Summary  Date of Service updated***     Patient Name: Bethany Odonnell DOB: 03-30-1986 MRN: [id number]  Date of admission: 04/21/2022 Delivery date:04/21/2022  Delivering provider: Shelda Pal  Date of discharge: 04/21/2022  Admitting diagnosis: Indication for care in labor or delivery [O75.9] Intrauterine pregnancy: [redacted]w[redacted]d    Secondary diagnosis:  Principal Problem:   Vaginal delivery Active Problems:   GBS bacteriuria   Supervision of other normal pregnancy, antepartum  Additional problems: ***    Discharge diagnosis: Term Pregnancy Delivered                                              Post partum procedures:{Postpartum procedures:23558} Augmentation: AROM Complications: None  Hospital course: Onset of Labor With Vaginal Delivery      36y.o. yo [id number] 373w6das admitted in Latent Labor on 04/21/2022. Labor course was complicated by none . Membrane Rupture Time/Date: 6:41 PM ,04/21/2022   Delivery Method:Vaginal, Spontaneous  Episiotomy: None  Lacerations:  Vaginal  Patient had a postpartum course complicated by ***.  She is ambulating, tolerating a regular diet, passing flatus, and urinating well. Patient is discharged home in stable condition on 04/21/22.  Newborn Data: Birth date:04/21/2022  Birth time:10:12 PM  Gender:Female  Living status:Living  Apgars: ,  Weight:   Magnesium Sulfate received: {Mag received:30440022} BMZ received: {BMZ received:30440023} Rhophylac:N/A MMR:N/A T-DaP:Given prenatally Flu: No Transfusion:{Transfusion received:30440034}  Physical exam  Vitals:   04/21/22 2000 04/21/22 2030 04/21/22 2100 04/21/22 2130  BP: (!) 103/58 (!) 99/51 (!) 93/47 97/71  Pulse: 89 92 85 97  Resp:      Temp:      TempSrc:      SpO2:      Weight:      Height:       General: {Exam; general:21111117} Lochia: {Desc; appropriate/inappropriate:30686::"appropriate"} Uterine Fundus: {Desc; firm/soft:30687} Incision:  {Exam; incision:21111123} DVT Evaluation: {Exam; dvt:2111122} Labs: Lab Results  Component Value Date   WBC 11.5 (H) 04/21/2022   HGB 10.2 (L) 04/21/2022   HCT 32.1 (L) 04/21/2022   MCV 85.6 04/21/2022   PLT 210 04/21/2022      Latest Ref Rng & Units 08/27/2021    8:49 PM  CMP  Glucose 70 - 99 mg/dL 80   BUN 6 - 20 mg/dL 6   Creatinine 0.44 - 1.00 mg/dL 0.66   Sodium 135 - 145 mmol/L 136   Potassium 3.5 - 5.1 mmol/L 3.9   Chloride 98 - 111 mmol/L 105   CO2 22 - 32 mmol/L 21   Calcium 8.9 - 10.3 mg/dL 8.8   Total Protein 6.5 - 8.1 g/dL 6.8   Total Bilirubin 0.3 - 1.2 mg/dL 0.5   Alkaline Phos 38 - 126 U/L 58   AST 15 - 41 U/L 17   ALT 0 - 44 U/L 10    Edinburgh Score:     No data to display           After visit meds:  Allergies as of 04/21/2022   No Known Allergies   Med Rec must be completed prior to [facility]        Discharge home in stable condition Infant Feeding: {Baby feeding:23562} Infant Disposition:{CHL IP OB HOME WITH MOTDDUKG:25427}ischarge instruction: per After Visit Summary and Postpartum booklet. Activity: Advance as tolerated. Pelvic rest for 6 weeks.  Diet: {OB OVFI:43329518} Future Appointments: Future Appointments  Date Time Provider [place]rtle  04/23/2022 10:10 AM Renee Harder, CNM [facility]   Follow up Visit: Message sent to [facility] on 11/05  Please schedule this patient for a In person postpartum visit in 4 weeks with the following provider: Any provider. Additional Postpartum F/U: n/a   Low risk pregnancy complicated by:  n/a Delivery mode:  Vaginal, Spontaneous  Anticipated Birth Control:   declined   04/21/2022 Shelda Pal, DO

## 2022-04-21 NOTE — Anesthesia Procedure Notes (Signed)
Epidural Patient location during procedure: OB Start time: 04/21/2022 4:45 PM End time: 04/21/2022 4:55 PM  Staffing Anesthesiologist: Freddrick March, MD Performed: anesthesiologist   Preanesthetic Checklist Completed: patient identified, IV checked, risks and benefits discussed, monitors and equipment checked, pre-op evaluation and timeout performed  Epidural Patient position: sitting Prep: DuraPrep and site prepped and draped Patient monitoring: continuous pulse ox, blood pressure, heart rate and cardiac monitor Approach: midline Location: L3-L4 Injection technique: LOR air  Needle:  Needle type: Tuohy  Needle gauge: 17 G Needle length: 9 cm Needle insertion depth: 6 cm Catheter type: closed end flexible Catheter size: 19 Gauge Catheter at skin depth: 11 cm Test dose: negative  Assessment Sensory level: T8 Events: blood not aspirated, injection not painful, no injection resistance, no paresthesia and negative IV test  Additional Notes Patient identified. Risks/Benefits/Options discussed with patient including but not limited to bleeding, infection, nerve damage, paralysis, failed block, incomplete pain control, headache, blood pressure changes, nausea, vomiting, reactions to medication both or allergic, itching and postpartum back pain. Confirmed with bedside nurse the patient's most recent platelet count. Confirmed with patient that they are not currently taking any anticoagulation, have any bleeding history or any family history of bleeding disorders. Patient expressed understanding and wished to proceed. All questions were answered. Sterile technique was used throughout the entire procedure. Please see nursing notes for vital signs. Test dose was given through epidural catheter and negative prior to continuing to dose epidural or start infusion. Warning signs of high block given to the patient including shortness of breath, tingling/numbness in hands, complete motor block,  or any concerning symptoms with instructions to call for help. Patient was given instructions on fall risk and not to get out of bed. All questions and concerns addressed with instructions to call with any issues or inadequate analgesia.  Reason for block:procedure for pain

## 2022-04-21 NOTE — MAU Note (Signed)
Bethany Odonnell is a 36 y.o. at 60w6dhere in MAU reporting: contractions that woke her up this morning around 0600. She reports they are progressively getting stronger and closer together. She denies having any vaginal bleeding or LOF, patient reports feeling fetal movement.   Onset of complaint: 0600 today Pain score: 7 Vitals:   04/21/22 0933 04/21/22 0935  BP:  103/65  Pulse:  92  Resp:  18  Temp: 98.4 F (36.9 C)   SpO2:  98%

## 2022-04-21 NOTE — Anesthesia Preprocedure Evaluation (Signed)
Anesthesia Evaluation  Patient identified by MRN, date of birth, ID band Patient awake    Reviewed: Allergy & Precautions, NPO status , Patient's Chart, lab work & pertinent test results  Airway Mallampati: II  TM Distance: >3 FB Neck ROM: Full    Dental no notable dental hx.    Pulmonary neg pulmonary ROS   Pulmonary exam normal breath sounds clear to auscultation       Cardiovascular negative cardio ROS Normal cardiovascular exam Rhythm:Regular Rate:Normal     Neuro/Psych  PSYCHIATRIC DISORDERS Anxiety     negative neurological ROS     GI/Hepatic Neg liver ROS,GERD  ,,  Endo/Other  negative endocrine ROS    Renal/GU negative Renal ROS  negative genitourinary   Musculoskeletal negative musculoskeletal ROS (+)    Abdominal   Peds  Hematology  (+) Blood dyscrasia, anemia   Anesthesia Other Findings Presents in labor  Reproductive/Obstetrics (+) Pregnancy                             Anesthesia Physical Anesthesia Plan  ASA: 2  Anesthesia Plan: Epidural   Post-op Pain Management:    Induction:   PONV Risk Score and Plan: Treatment may vary due to age or medical condition  Airway Management Planned: Natural Airway  Additional Equipment:   Intra-op Plan:   Post-operative Plan:   Informed Consent: I have reviewed the patients History and Physical, chart, labs and discussed the procedure including the risks, benefits and alternatives for the proposed anesthesia with the patient or authorized representative who has indicated his/her understanding and acceptance.       Plan Discussed with: Anesthesiologist  Anesthesia Plan Comments: (Patient identified. Risks, benefits, options discussed with patient including but not limited to bleeding, infection, nerve damage, paralysis, failed block, incomplete pain control, headache, blood pressure changes, nausea, vomiting, reactions to  medication, itching, and post partum back pain. Confirmed with bedside nurse the patient's most recent platelet count. Confirmed with the patient that they are not taking any anticoagulation, have any bleeding history or any family history of bleeding disorders. Patient expressed understanding and wishes to proceed. All questions were answered. )       Anesthesia Quick Evaluation

## 2022-04-22 LAB — RPR: RPR Ser Ql: NONREACTIVE

## 2022-04-22 MED ORDER — BENZOCAINE-MENTHOL 20-0.5 % EX AERO
1.0000 | INHALATION_SPRAY | CUTANEOUS | Status: DC | PRN
  Administered 2022-04-22: 1 via TOPICAL
  Filled 2022-04-22: qty 56

## 2022-04-22 MED ORDER — ONDANSETRON HCL 4 MG/2ML IJ SOLN: 4.0000 mg | INTRAMUSCULAR | Status: DC | PRN

## 2022-04-22 MED ORDER — DIPHENHYDRAMINE HCL 25 MG PO CAPS: 25.0000 mg | ORAL_CAPSULE | Freq: Four times a day (QID) | ORAL | Status: DC | PRN

## 2022-04-22 MED ORDER — DIBUCAINE (PERIANAL) 1 % EX OINT: 1.0000 | TOPICAL_OINTMENT | CUTANEOUS | Status: DC | PRN

## 2022-04-22 MED ORDER — SENNOSIDES-DOCUSATE SODIUM 8.6-50 MG PO TABS: 2.0000 | ORAL_TABLET | Freq: Every day | ORAL | Status: DC

## 2022-04-22 MED ORDER — IBUPROFEN 100 MG/5ML PO SUSP
600.0000 mg | Freq: Four times a day (QID) | ORAL | Status: DC
  Administered 2022-04-22 (×2): 600 mg via ORAL
  Filled 2022-04-22 (×3): qty 30

## 2022-04-22 MED ORDER — ZOLPIDEM TARTRATE 5 MG PO TABS: 5.0000 mg | ORAL_TABLET | Freq: Every evening | ORAL | Status: DC | PRN

## 2022-04-22 MED ORDER — PRENATAL MULTIVITAMIN CH: 1.0000 | ORAL_TABLET | Freq: Every day | ORAL | Status: DC

## 2022-04-22 MED ORDER — CALCIUM CARBONATE ANTACID 500 MG PO CHEW: 1.0000 | CHEWABLE_TABLET | ORAL | Status: DC | PRN

## 2022-04-22 MED ORDER — WITCH HAZEL-GLYCERIN EX PADS: 1.0000 | MEDICATED_PAD | CUTANEOUS | Status: DC | PRN

## 2022-04-22 MED ORDER — SIMETHICONE 80 MG PO CHEW: 80.0000 mg | CHEWABLE_TABLET | ORAL | Status: DC | PRN

## 2022-04-22 MED ORDER — ACETAMINOPHEN 325 MG PO TABS: 650.0000 mg | ORAL_TABLET | ORAL | Status: DC | PRN

## 2022-04-22 MED ORDER — COCONUT OIL OIL: 1.0000 | TOPICAL_OIL | Status: DC | PRN

## 2022-04-22 MED ORDER — IBUPROFEN 600 MG PO TABS
600.0000 mg | ORAL_TABLET | Freq: Four times a day (QID) | ORAL | Status: DC
  Filled 2022-04-22: qty 1

## 2022-04-22 MED ORDER — TETANUS-DIPHTH-ACELL PERTUSSIS 5-2.5-18.5 LF-MCG/0.5 IM SUSY: 0.5000 mL | PREFILLED_SYRINGE | Freq: Once | INTRAMUSCULAR | Status: DC

## 2022-04-22 MED ORDER — ONDANSETRON HCL 4 MG PO TABS: 4.0000 mg | ORAL_TABLET | ORAL | Status: DC | PRN

## 2022-04-22 NOTE — Anesthesia Postprocedure Evaluation (Signed)
Anesthesia Post Note  Patient: Bethany Odonnell  Procedure(s) Performed: AN AD Wallace     Patient location during evaluation: Mother Baby Anesthesia Type: Epidural Level of consciousness: awake, oriented and awake and alert Pain management: pain level controlled Vital Signs Assessment: post-procedure vital signs reviewed and stable Respiratory status: spontaneous breathing, respiratory function stable and nonlabored ventilation Cardiovascular status: stable Postop Assessment: no headache, adequate PO intake, able to ambulate, patient able to bend at knees and no apparent nausea or vomiting Anesthetic complications: no   No notable events documented.  Last Vitals:  Vitals:   04/22/22 0115 04/22/22 0600  BP: 101/60 104/71  Pulse: 86 84  Resp:    Temp: 36.6 C 36.7 C  SpO2: 99% 98%    Last Pain:  Vitals:   04/22/22 0600  TempSrc: Oral  PainSc: 0-No pain   Pain Goal: Patients Stated Pain Goal: 0 (04/21/22 1614)                 Sunday Klos

## 2022-04-22 NOTE — Lactation Note (Signed)
Lactation Consultation Note  Patient Name: Bethany Odonnell [id number] Date: 04/22/2022   Age:36 y.o.  Maternal Data    Feeding    LATCH Score                    Lactation Tools Discussed/Used    Interventions    Discharge    Consult Status Consult Status: Complete    [name] 04/22/2022, 7:59 PM

## 2022-04-22 NOTE — Lactation Note (Signed)
This note was copied from a baby's chart. Lactation Consultation Note  Patient Name: Bethany Odonnell Date: 04/22/2022 Reason for consult: Initial assessment;Early term 37-38.6wks Age:34 hours Mom stated the baby has all ready been cluster feeding. The baby has wanted to BF every hour. Mom stated she has been leaking colostrum. Mom wanted to pump. [place] set up DEBP. Mom needed #21 flanges.  Newborn behavior, STS, I&O, supply and demand reviewed. Mom encouraged to feed baby 8-12 times/24 hours and with feeding cues.  Instruction on DEBP and cleaning reviewed. Mom has a Medela DEBP at home. Mom denies painful latching. Praised mom for all her hard work. Encouraged mom to get rest while baby sleeping. Mom was getting sleepy during pumping as well as thirst and cramping. Encouraged mom to call for questions or concerns.  Maternal Data Has patient been taught Hand Expression?: Yes Does the patient have breastfeeding experience prior to this delivery?: Yes How long did the patient breastfeed?: 3 months  Feeding    LATCH Score       Type of Nipple: Everted at rest and after stimulation  Comfort (Breast/Nipple): Soft / non-tender         Lactation Tools Discussed/Used Tools: Pump;Flanges Flange Size: 21 Breast pump type: Double-Electric Breast Pump Pump Education: Setup, frequency, and cleaning;Milk Storage Reason for Pumping: mom wanted to pump for stimulation Pumping frequency: q3hr/PRN Pumped volume: 1 mL  Interventions Interventions: Breast feeding basics reviewed;DEBP;Skin to skin;Expressed milk;Breast massage;Hand express;Breast compression;LC Services brochure  Discharge    Consult Status Consult Status: Follow-up Date: 04/23/22 Follow-up type: In-patient    Shemeka Wardle, Elta [place] 04/22/2022, 4:07 AM

## 2022-04-22 NOTE — Social Work (Signed)
MOB was referred for history of depression/anxiety.  * Referral screened out by Clinical Social Worker because none of the following criteria appear to apply:  ~ History of anxiety/depression during this pregnancy, or of post-partum depression following prior delivery.  ~ Diagnosis of anxiety and/or depression within last 3 years OR * MOB's symptoms currently being treated with medication and/or therapy.  Per chart review MOB diagnosis was prior to November 2020. NO noted symptoms during this pregnancy.   Please contact the Clinical Social Worker if needs arise or by MOB request.  Letta Kocher, [place] Social Worker [phone]

## 2022-04-22 NOTE — Progress Notes (Signed)
POSTPARTUM PROGRESS NOTE  Post Partum Day 1  Subjective:  Bethany Odonnell is a 36 y.o. [id number] s/p SVD at [redacted]w[redacted]d  She reports she is doing well. No acute events overnight. She denies any problems with ambulating, voiding or po intake. Denies nausea or vomiting.  Pain is well controlled.  Lochia is small.  Objective: Blood pressure 104/71, pulse 84, temperature 98.1 F (36.7 C), temperature source Oral, resp. rate 18, height '5\' 7"'$  (1.702 m), weight 80.7 kg, last menstrual period 07/23/2021, SpO2 98 %, unknown if currently breastfeeding.  Physical Exam:  General: alert, cooperative and no distress Chest: no respiratory distress Heart:regular rate, distal pulses intact Abdomen: soft, nontender,  Uterine Fundus: firm, appropriately tender DVT Evaluation: No calf swelling or tenderness Extremities: No edema Skin: warm, dry  Recent Labs    04/21/22 1501  HGB 10.2*  HCT 32.1*    Assessment/Plan: Bethany Postlethwaitis a 36y.o. [id number]/p SVD at 368w6d PPD#1 - Doing well Routine postpartum care Contraception: Declines Feeding: Breastfeeding Dispo: Plan for discharge tomorrow.   LOS: 1 day   AdSandi CarneSNM 04/22/2022, 7:31 AM

## 2022-04-22 NOTE — Lactation Note (Signed)
This note was copied from a baby's chart. Lactation Consultation Note  Patient Name: Bethany Odonnell [id number] Date: 04/22/2022 Reason for consult: Follow-up assessment;Early term 37-38.6wks Age:34 hours Mom is very sleepy. Baby is cluster feeding. Rail on side pulled up for safety. The other side is down. Mom is cramping and thirsty. She asked LC to please get her some ice water. LC did so. Baby off and on and LC adjusted baby and baby stayed on. Mom denies painful latches. Mom doesn't have any questions at this time. Just needing a nap. No support person at bedside at this time. Encouraged mom if needs assistance to call. Maternal Data    Feeding    LATCH Score Latch: Grasps breast easily, tongue down, lips flanged, rhythmical sucking.  Audible Swallowing: Spontaneous and intermittent  Type of Nipple: Everted at rest and after stimulation  Comfort (Breast/Nipple): Soft / non-tender  Hold (Positioning): Assistance needed to correctly position infant at breast and maintain latch.  LATCH Score: 9   Lactation Tools Discussed/Used    Interventions Interventions: Adjust position;Assisted with latch;Support pillows;Skin to skin;Breast compression  Discharge    Consult Status Consult Status: Follow-up Date: 04/23/22 Follow-up type: In-patient    Theodoro Kalata 04/22/2022, 10:07 PM

## 2022-04-23 NOTE — Plan of Care (Signed)
Problem: Education: Goal: Knowledge of Childbirth will improve 04/23/2022 0820 by Rodolph Bong, LPN Outcome: Adequate for Discharge 04/23/2022 0820 by Rodolph Bong, LPN Outcome: Adequate for Discharge Goal: Ability to make informed decisions regarding treatment and plan of care will improve 04/23/2022 0820 by Rodolph Bong, LPN Outcome: Adequate for Discharge 04/23/2022 0820 by Rodolph Bong, LPN Outcome: Adequate for Discharge Goal: Ability to state and carry out methods to decrease the pain will improve 04/23/2022 0820 by Rodolph Bong, LPN Outcome: Adequate for Discharge 04/23/2022 0820 by Rodolph Bong, LPN Outcome: Adequate for Discharge Goal: Individualized Educational Video(s) 04/23/2022 0820 by Rodolph Bong, LPN Outcome: Adequate for Discharge 04/23/2022 0820 by Rodolph Bong, LPN Outcome: Adequate for Discharge   Problem: Coping: Goal: Ability to verbalize concerns and feelings about labor and delivery will improve 04/23/2022 0820 by Rodolph Bong, LPN Outcome: Adequate for Discharge 04/23/2022 0820 by Rodolph Bong, LPN Outcome: Adequate for Discharge   Problem: Life Cycle: Goal: Ability to make normal progression through stages of labor will improve 04/23/2022 0820 by Rodolph Bong, LPN Outcome: Adequate for Discharge 04/23/2022 0820 by Rodolph Bong, LPN Outcome: Adequate for Discharge Goal: Ability to effectively push during vaginal delivery will improve 04/23/2022 0820 by Rodolph Bong, LPN Outcome: Adequate for Discharge 04/23/2022 0820 by Rodolph Bong, LPN Outcome: Adequate for Discharge   Problem: Role Relationship: Goal: Will demonstrate positive interactions with the child 04/23/2022 0820 by Rodolph Bong, LPN Outcome: Adequate for Discharge 04/23/2022 0820 by Rodolph Bong, LPN Outcome: Adequate for Discharge   Problem: Safety: Goal: Risk of complications during labor and delivery will decrease 04/23/2022 0820 by Rodolph Bong, LPN Outcome:  Adequate for Discharge 04/23/2022 0820 by Rodolph Bong, LPN Outcome: Adequate for Discharge   Problem: Pain Management: Goal: Relief or control of pain from uterine contractions will improve 04/23/2022 0820 by Rodolph Bong, LPN Outcome: Adequate for Discharge 04/23/2022 0820 by Rodolph Bong, LPN Outcome: Adequate for Discharge   Problem: Health Behavior/Discharge Planning: Goal: Ability to manage health-related needs will improve 04/23/2022 0820 by Rodolph Bong, LPN Outcome: Adequate for Discharge 04/23/2022 0820 by Rodolph Bong, LPN Outcome: Adequate for Discharge   Problem: Coping: Goal: Level of anxiety will decrease 04/23/2022 0820 by Rodolph Bong, LPN Outcome: Adequate for Discharge 04/23/2022 0820 by Rodolph Bong, LPN Outcome: Adequate for Discharge   Problem: Education: Goal: Individualized Educational Video(s) 04/23/2022 0820 by Rodolph Bong, LPN Outcome: Adequate for Discharge 04/23/2022 0820 by Rodolph Bong, LPN Outcome: Adequate for Discharge Goal: Individualized Newborn Educational Video(s) 04/23/2022 0820 by Rodolph Bong, LPN Outcome: Adequate for Discharge 04/23/2022 0820 by Rodolph Bong, LPN Outcome: Adequate for Discharge   Problem: Coping: Goal: Ability to identify and utilize available resources and services will improve 04/23/2022 0820 by Rodolph Bong, LPN Outcome: Adequate for Discharge 04/23/2022 0820 by Rodolph Bong, LPN Outcome: Adequate for Discharge   Problem: Life Cycle: Goal: Chance of risk for complications during the postpartum period will decrease 04/23/2022 0820 by Rodolph Bong, LPN Outcome: Adequate for Discharge 04/23/2022 0820 by Rodolph Bong, LPN Outcome: Adequate for Discharge   Problem: Role Relationship: Goal: Ability to demonstrate positive interaction with newborn will improve 04/23/2022 0820 by Rodolph Bong, LPN Outcome: Adequate for Discharge 04/23/2022 0820 by Rodolph Bong, LPN Outcome: Adequate for  Discharge   Problem: Skin Integrity: Goal: Demonstration of wound healing without infection  will improve 04/23/2022 0820 by Rodolph Bong, LPN Outcome: Adequate for Discharge 04/23/2022 0820 by Rodolph Bong, LPN Outcome: Adequate for Discharge   Problem: Education: Goal: Knowledge of condition will improve 04/23/2022 0820 by Rodolph Bong, LPN Outcome: Adequate for Discharge 04/23/2022 0820 by Rodolph Bong, LPN Outcome: Adequate for Discharge Goal: Individualized Educational Video(s) 04/23/2022 0820 by Rodolph Bong, LPN Outcome: Adequate for Discharge 04/23/2022 0820 by Rodolph Bong, LPN Outcome: Adequate for Discharge Goal: Individualized Newborn Educational Video(s) 04/23/2022 0820 by Rodolph Bong, LPN Outcome: Adequate for Discharge 04/23/2022 0820 by Rodolph Bong, LPN Outcome: Adequate for Discharge   Problem: Activity: Goal: Will verbalize the importance of balancing activity with adequate rest periods 04/23/2022 0820 by Rodolph Bong, LPN Outcome: Adequate for Discharge 04/23/2022 0820 by Rodolph Bong, LPN Outcome: Adequate for Discharge Goal: Ability to tolerate increased activity will improve 04/23/2022 0820 by Rodolph Bong, LPN Outcome: Adequate for Discharge 04/23/2022 0820 by Rodolph Bong, LPN Outcome: Adequate for Discharge   Problem: Coping: Goal: Ability to identify and utilize available resources and services will improve 04/23/2022 0820 by Rodolph Bong, LPN Outcome: Adequate for Discharge 04/23/2022 0820 by Rodolph Bong, LPN Outcome: Adequate for Discharge   Problem: Life Cycle: Goal: Chance of risk for complications during the postpartum period will decrease 04/23/2022 0820 by Rodolph Bong, LPN Outcome: Adequate for Discharge 04/23/2022 0820 by Rodolph Bong, LPN Outcome: Adequate for Discharge   Problem: Role Relationship: Goal: Ability to demonstrate positive interaction with newborn will improve 04/23/2022 0820 by Rodolph Bong,  LPN Outcome: Adequate for Discharge 04/23/2022 0820 by Rodolph Bong, LPN Outcome: Adequate for Discharge   Problem: Skin Integrity: Goal: Demonstration of wound healing without infection will improve 04/23/2022 0820 by Rodolph Bong, LPN Outcome: Adequate for Discharge 04/23/2022 0820 by Rodolph Bong, LPN Outcome: Adequate for Discharge

## 2022-04-23 NOTE — Plan of Care (Signed)
  Problem: Education: Goal: Knowledge of General Education information will improve Description: Including pain rating scale, medication(s)/side effects and non-pharmacologic comfort measures Outcome: Completed/Met   Problem: Clinical Measurements: Goal: Ability to maintain clinical measurements within normal limits will improve Outcome: Completed/Met Goal: Will remain free from infection Outcome: Completed/Met Goal: Diagnostic test results will improve Outcome: Completed/Met Goal: Respiratory complications will improve Outcome: Completed/Met Goal: Cardiovascular complication will be avoided Outcome: Completed/Met   Problem: Activity: Goal: Risk for activity intolerance will decrease Outcome: Completed/Met   Problem: Nutrition: Goal: Adequate nutrition will be maintained Outcome: Completed/Met   Problem: Elimination: Goal: Will not experience complications related to bowel motility Outcome: Completed/Met Goal: Will not experience complications related to urinary retention Outcome: Completed/Met   Problem: Pain Managment: Goal: General experience of comfort will improve Outcome: Completed/Met   Problem: Safety: Goal: Ability to remain free from injury will improve Outcome: Completed/Met   Problem: Skin Integrity: Goal: Risk for impaired skin integrity will decrease Outcome: Completed/Met   Problem: Education: Goal: Knowledge of condition will improve Outcome: Completed/Met   Problem: Activity: Goal: Will verbalize the importance of balancing activity with adequate rest periods Outcome: Completed/Met Goal: Ability to tolerate increased activity will improve Outcome: Completed/Met   Problem: Education: Goal: Knowledge of General Education information will improve Description: Including pain rating scale, medication(s)/side effects and non-pharmacologic comfort measures Outcome: Completed/Met   Problem: Health Behavior/Discharge Planning: Goal: Ability to manage  health-related needs will improve Outcome: Completed/Met   Problem: Clinical Measurements: Goal: Ability to maintain clinical measurements within normal limits will improve Outcome: Completed/Met Goal: Will remain free from infection Outcome: Completed/Met Goal: Diagnostic test results will improve Outcome: Completed/Met Goal: Respiratory complications will improve Outcome: Completed/Met Goal: Cardiovascular complication will be avoided Outcome: Completed/Met   Problem: Activity: Goal: Risk for activity intolerance will decrease Outcome: Completed/Met   Problem: Nutrition: Goal: Adequate nutrition will be maintained Outcome: Completed/Met   Problem: Coping: Goal: Level of anxiety will decrease Outcome: Completed/Met   Problem: Elimination: Goal: Will not experience complications related to bowel motility Outcome: Completed/Met Goal: Will not experience complications related to urinary retention Outcome: Completed/Met   Problem: Pain Managment: Goal: General experience of comfort will improve Outcome: Completed/Met   Problem: Safety: Goal: Ability to remain free from injury will improve Outcome: Completed/Met   Problem: Skin Integrity: Goal: Risk for impaired skin integrity will decrease Outcome: Completed/Met

## 2022-04-23 NOTE — Lactation Note (Signed)
This note was copied from a baby's chart. Lactation Consultation Note  Patient Name: Bethany Odonnell [id number] Date: 04/23/2022 Reason for consult: Follow-up assessment;Early term 37-38.6wks;Infant weight loss Age:34 hours/  2% weight loss,  As LC entered the room baby was latched on the Left breast , shallow.  Baby released and LC assisted to latch with depth and pe rmom felt much better. LC reviewed and updated the doc flow sheets  per mom . Changed a large wet. Latch score 8  LC reviewed BF D/C teaching and LC resources after D/C.   Maternal Data    Feeding Mother's Current Feeding Choice: Breast Milk  LATCH Score Latch: Grasps breast easily, tongue down, lips flanged, rhythmical sucking.  Audible Swallowing: Spontaneous and intermittent  Type of Nipple: Everted at rest and after stimulation  Comfort (Breast/Nipple): Filling, red/small blisters or bruises, mild/mod discomfort  Hold (Positioning): Assistance needed to correctly position infant at breast and maintain latch.  LATCH Score: 8   Lactation Tools Discussed/Used Tools: Pump;Flanges Flange Size: 21 Breast pump type: Double-Electric Breast Pump;Manual Pump Education: Milk Storage  Interventions Interventions: Breast feeding basics reviewed;Assisted with latch;Skin to skin;Breast massage;Hand express;Reverse pressure;Breast compression;Adjust position;Support pillows;Position options;Hand pump;DEBP;Education  Discharge Discharge Education: Engorgement and breast care;Warning signs for feeding baby Pump: DEBP;Personal  Consult Status Consult Status: Complete Date: 04/23/22    [name] 04/23/2022, 10:14 AM

## 2022-05-01 ENCOUNTER — Telehealth (HOSPITAL_COMMUNITY): Payer: Self-pay

## 2022-05-01 NOTE — Telephone Encounter (Signed)
Patient did not answer phone call. Voicemail left for patient.   Sharyn Lull [facility] 05/01/2022,1843

## 2022-05-23 NOTE — Progress Notes (Deleted)
    [facility] Partum Visit Note  Bethany Odonnell is a 36 y.o. [phone] female who presents for a postpartum visit. She is 4 weeks postpartum following a normal spontaneous vaginal delivery.  I have fully reviewed the prenatal and intrapartum course. The delivery was at 3806 gestational weeks.  Anesthesia: epidural. Postpartum course has been ***. Baby is doing well***. Baby is feeding by {breastmilk/bottle:69}. Bleeding {vag bleed:12292}. Bowel function is {normal:32111}. Bladder function is {normal:32111}. Patient {is/is not:9024} sexually active. Contraception method is {contraceptive method:5051}. Postpartum depression screening: {gen negative/positive:315881}.   The pregnancy intention screening data noted above was reviewed. Potential methods of contraception were discussed. The patient elected to proceed with No data recorded.    Health Maintenance Due  Topic Date Due   INFLUENZA VACCINE  Never done   COVID-19 Vaccine (3 - 2023-24 season) 02/15/2022    {Common ambulatory SmartLinks:19316}  Review of Systems {ros; complete:30496}  Objective:  LMP 07/23/2021    General:  {gen appearance:16600}   Breasts:  {desc; normal/abnormal/not indicated:14647}  Lungs: {lung exam:16931}  Heart:  {heart exam:5510}  Abdomen: {abdomen exam:16834}   Wound {Wound assessment:11097}  GU exam:  {desc; normal/abnormal/not indicated:14647}       Assessment:    There are no diagnoses linked to this encounter.  *** postpartum exam.   Plan:   Essential components of care per ACOG recommendations:  1.  Mood and well being: Patient with {gen negative/positive:315881} depression screening today. Reviewed local resources for support.  - Patient tobacco use? {tobacco use:25506}  - hx of drug use? {yes/no:25505}    2. Infant care and feeding:  -Patient currently breastmilk feeding? {yes/no:25502}  -Social determinants of health (SDOH) reviewed in EPIC. No concerns***The following needs were  identified***  3. Sexuality, contraception and birth spacing - Patient {DOES_DOES QXI:50388} want a pregnancy in the next year.  Desired family size is {NUMBER 1-10:22536} children.  - Reviewed reproductive life planning. Reviewed contraceptive methods based on pt preferences and effectiveness.  Patient desired {Upstream End Methods:24109} today.   - Discussed birth spacing of 18 months  4. Sleep and fatigue -Encouraged family/partner/community support of 4 hrs of uninterrupted sleep to help with mood and fatigue  5. Physical Recovery  - Discussed patients delivery and complications. She describes her labor as {description:25511} - Patient had a {CHL AMB DELIVERY:[phone]}. Patient had a {laceration:25518} laceration. Perineal healing reviewed. Patient expressed understanding - Patient has urinary incontinence? {yes/no:25515} - Patient {ACTION; IS/IS EKC:[id number]} safe to resume physical and sexual activity  6.  Health Maintenance - HM due items addressed {Yes or If no, why not?:20788} - Last pap smear  Diagnosis  Date Value Ref Range Status  07/17/2021   Final   - Negative for intraepithelial lesion or malignancy (NILM)   Pap smear {done:10129} at today's visit.  -Breast Cancer screening indicated? {indicated:25516}  7. Chronic Disease/Pregnancy Condition follow up: {Follow up:25499}  - PCP follow up  Lyndal Rainbow, [place] for [employer], [place]

## 2022-05-24 ENCOUNTER — Ambulatory Visit: Payer: 59 | Admitting: Certified Nurse Midwife

## 2022-05-24 ENCOUNTER — Telehealth: Payer: Self-pay | Admitting: *Deleted

## 2022-05-24 NOTE — Telephone Encounter (Signed)
Left patient an urgent message to see if she is still coming to her appointment today.
